# Patient Record
Sex: Female | Born: 1955 | Race: Black or African American | Hispanic: No | Marital: Married | Smoking: Never smoker
Health system: Southern US, Community
[De-identification: ages and names within clinical notes are randomized; demographics above are authoritative.]

## PROBLEM LIST (undated history)

## (undated) DIAGNOSIS — E785 Hyperlipidemia, unspecified: Secondary | ICD-10-CM

## (undated) DIAGNOSIS — E119 Type 2 diabetes mellitus without complications: Secondary | ICD-10-CM

## (undated) DIAGNOSIS — I10 Essential (primary) hypertension: Secondary | ICD-10-CM

## (undated) DIAGNOSIS — E079 Disorder of thyroid, unspecified: Secondary | ICD-10-CM

## (undated) HISTORY — DX: Hyperlipidemia, unspecified: E78.5

## (undated) HISTORY — DX: Type 2 diabetes mellitus without complications: E11.9

## (undated) HISTORY — DX: Disorder of thyroid, unspecified: E07.9

---

## 1978-11-10 HISTORY — PX: BREAST SURGERY: SHX581

## 2015-07-18 ENCOUNTER — Ambulatory Visit: Payer: Self-pay | Attending: Family Medicine

## 2015-07-20 ENCOUNTER — Ambulatory Visit: Payer: Self-pay

## 2015-07-24 ENCOUNTER — Ambulatory Visit: Payer: Self-pay

## 2015-09-17 ENCOUNTER — Ambulatory Visit (INDEPENDENT_AMBULATORY_CARE_PROVIDER_SITE_OTHER): Payer: Self-pay | Admitting: Internal Medicine

## 2015-09-17 ENCOUNTER — Encounter: Payer: Self-pay | Admitting: Internal Medicine

## 2015-09-17 VITALS — BP 130/64 | HR 57 | Temp 97.5°F | Wt 147.9 lb

## 2015-09-17 DIAGNOSIS — K1379 Other lesions of oral mucosa: Secondary | ICD-10-CM

## 2015-09-17 DIAGNOSIS — E119 Type 2 diabetes mellitus without complications: Secondary | ICD-10-CM

## 2015-09-17 DIAGNOSIS — E1169 Type 2 diabetes mellitus with other specified complication: Secondary | ICD-10-CM

## 2015-09-17 DIAGNOSIS — Z9229 Personal history of other drug therapy: Secondary | ICD-10-CM

## 2015-09-17 DIAGNOSIS — Z126 Encounter for screening for malignant neoplasm of bladder: Secondary | ICD-10-CM

## 2015-09-17 DIAGNOSIS — K062 Gingival and edentulous alveolar ridge lesions associated with trauma: Secondary | ICD-10-CM | POA: Insufficient documentation

## 2015-09-17 DIAGNOSIS — E785 Hyperlipidemia, unspecified: Secondary | ICD-10-CM

## 2015-09-17 DIAGNOSIS — E890 Postprocedural hypothyroidism: Secondary | ICD-10-CM

## 2015-09-17 DIAGNOSIS — K121 Other forms of stomatitis: Secondary | ICD-10-CM

## 2015-09-17 DIAGNOSIS — E0590 Thyrotoxicosis, unspecified without thyrotoxic crisis or storm: Secondary | ICD-10-CM

## 2015-09-17 DIAGNOSIS — E784 Other hyperlipidemia: Secondary | ICD-10-CM

## 2015-09-17 DIAGNOSIS — Z0000 Encounter for general adult medical examination without abnormal findings: Secondary | ICD-10-CM

## 2015-09-17 DIAGNOSIS — Z8619 Personal history of other infectious and parasitic diseases: Secondary | ICD-10-CM

## 2015-09-17 DIAGNOSIS — Z1239 Encounter for other screening for malignant neoplasm of breast: Secondary | ICD-10-CM

## 2015-09-17 LAB — COMPREHENSIVE METABOLIC PANEL
ALK PHOS: 96 U/L (ref 38–126)
ALT: 18 U/L (ref 14–54)
AST: 23 U/L (ref 15–41)
Albumin: 4.1 g/dL (ref 3.5–5.0)
Anion gap: 6 (ref 5–15)
BILIRUBIN TOTAL: 0.6 mg/dL (ref 0.3–1.2)
BUN: 10 mg/dL (ref 6–20)
CALCIUM: 10.1 mg/dL (ref 8.9–10.3)
CO2: 28 mmol/L (ref 22–32)
CREATININE: 0.61 mg/dL (ref 0.44–1.00)
Chloride: 108 mmol/L (ref 101–111)
GFR calc Af Amer: >60 mL/min (ref >60)
Glucose, Bld: 122 mg/dL — ABNORMAL HIGH (ref 65–99)
Potassium: 4.3 mmol/L (ref 3.5–5.1)
Sodium: 142 mmol/L (ref 135–145)
TOTAL PROTEIN: 7.2 g/dL (ref 6.5–8.1)

## 2015-09-17 LAB — POCT GLYCOSYLATED HEMOGLOBIN (HGB A1C): Hemoglobin A1C: 7.1

## 2015-09-17 LAB — GLUCOSE, CAPILLARY: Glucose-Capillary: 110 mg/dL — ABNORMAL HIGH (ref 65–99)

## 2015-09-17 MED ORDER — ATORVASTATIN CALCIUM 20 MG PO TABS: 20.0000 mg | ORAL_TABLET | Freq: Every day | ORAL | Status: DC

## 2015-09-17 MED ORDER — METFORMIN HCL 500 MG PO TABS: 500.0000 mg | ORAL_TABLET | Freq: Two times a day (BID) | ORAL | Status: DC

## 2015-09-17 MED ORDER — LISINOPRIL 5 MG PO TABS: 5.0000 mg | ORAL_TABLET | Freq: Every day | ORAL | Status: DC

## 2015-09-17 NOTE — Assessment & Plan Note (Signed)
Patient has previously been on multiple different statins, most recently atorvastatin 20. -Will continue atorvastatin 20mg  as this is a moderate intensity statin therapy as indicated for T2DM -Will obtain a baseline lipid panel here

## 2015-09-17 NOTE — Assessment & Plan Note (Signed)
Currently endorses hair falling out x 4 months without other obvious s/s of hyperthyroidism. On synthroid currently. -Will check TSH today -Adjust synthroid dose PRN once TSH results

## 2015-09-17 NOTE — Progress Notes (Signed)
   Patient ID: Roberta Mullins female   DOB: 03/17/1956 59 y.o.   MRN: [id number]  Subjective:   HPI: [name] PoundsMohamed Mullins is a 59 y.o. with PMH of DM2, multinodular goiter s/p RAI ablation in 2008 (now on synthroid), HLD and h/o HRT for menopausal symptoms (x2824yrs until 2010) who presents to [facility] today to establish care as a new patient in our clinic.   At this time, she says she is feeling well. She does complain of 'tightness' around her dentures that has persisted for several years and would like to see a dentist to evaluate. She also has noticed 2 small subcutaneous nodules in her bilateral 2nd DIP joints without any associated pain or stiffness. Finally, she says that for the past several months, she has noticed occasionally losing a small amount of urine when she laughs, coughs, or sneezes. She denies urinary urgency or frequency, and denies dysuria. She says she has been losing some hair when she brushes it over the past 3-4 months as well and is wondering whether or not she is taking too much synthroid. She denies other hyperthyroid symptoms, such as weight loss, heat intolerance, or palpitations.  Otherwise, she denies fevers, unexpected weight changes, malaise, fatigue, chest pain, cough, nausea, abdominal pain, changes in bowel function, numbness, weakness, leg swelling, or rashes.  Please see problem-based charting for status of medical issues pertinent to this visit.  Review of Systems: Pertinent items noted in HPI and remainder of comprehensive ROS otherwise negative.  PMH: Multinodular goiter s/p RAI ablation in 2008, now on synthroid 100mcg Type 2 DM without complication - for 5-6 years - on metformin, glimeperide, and lisinopril HLD - on atorvastatin 20mg  History of shingles in 2008 on R posterior thigh  PSH: Fibroadenoma excision in her late teens. Denture placement several years ago  Meds: See as above  Allergies: NKDA  Social: Patient is an Psychiatric nurseenvironmental  activist and professor. She and her family moved from [place] to be with her daughter, who was diagnosed with metastatic breast cancer and later deceased, as well as to take care of their grandchildren. She is a never-smoker, does not drink, and does not use recreational drugs.  Family Hx: Mother passed away from ovarian cancer in her 860s, sister was treated for DCIS of the breast with surgery and tamoxifen in her 9850s, daughter was diagnosed with metastatic IDC of breast at 4838 and passed away. Father had DM and HTN. Otherwise no other DM, HTN, CVD or other cancers noted in the family.  Objective:  Physical Exam: Filed Vitals:   09/17/15 0942  BP: 130/64  Pulse: 57  Temp: 97.5 F (36.4 C)  TempSrc: Oral  Weight: 147 lb 14.4 oz (67.087 kg)  SpO2: 100%   Gen: Well-appearing, alert and oriented to person, place, and time HEENT: Oropharynx clear without erythema or exudate.  Neck: No cervical LAD, no thyromegaly or nodules, no JVD noted. CV: Normal rate, regular rhythm, no murmurs, rubs, or gallops Pulmonary: Normal effort, CTA bilaterally, no wheezing, rales, or rhonchi Abdominal: Soft, non-tender, non-distended, without rebound, guarding, or masses Extremities: Distal pulses 2+ in upper and lower extremities bilaterally, no tenderness, erythema or edema Skin: No atypical appearing moles. No rashes  Assessment & Plan:  Please see problem-based charting for assessment and plan.  Roberta MilanBilly Nailah Luepke, MD Resident Physician, PGY-1 Department of Internal Medicine [facility]

## 2015-09-17 NOTE — Assessment & Plan Note (Signed)
-  Received flu shot today -Referral for mammogram today -Forgot to ask last colonoscopy and Pap - will ask at next follow-up and manage accordingly

## 2015-09-17 NOTE — Assessment & Plan Note (Addendum)
Patient was taking glimepiride 2mg  TID and metformin 500mg  TID, both only when she ate big meals. A1c today was 7.1, CBG 110. -Currently, patient agreeable to only taking metformin 500mg  BID, but not PRN, and to hold glimepiride at this time. Will plan on titrating up metformin as needed depending on glycemic control. -Continue lisinopril and atorvastatin -Obtain baseline BMP -Referral for eye exam

## 2015-09-17 NOTE — Assessment & Plan Note (Signed)
Patient says she has had tightness around her dentures causing discomfort for several years but has been unable to see a dentist. Exam today shows no obvious signs of candidiasis or other infection. -Will refer to dentistry for evaluation of denture fit

## 2015-09-17 NOTE — Patient Instructions (Signed)
I have made referrals to ophtalmology, dental, and mammography for your respective exams. All of these should be contacting you soon to make appointments. Let us know if you haven't been contacted soon.  We will check your thyroid levels and change the medicine accordingly. Until then, continue to take your medication as you have been.  For your diabetes medications, stop taking the Glimulin-2 altogether. Continue to take the metformin 500mg  twice a day with meals every day, whether or not you have large meals or not.   Continue taking the omega 3, lisinopril, and atorvastatin as directed. I have put in refills to [facility] on [address] as well. Let us know if you need anything else in the meantime.  It was very nice meeting you!  Roberta MilanBilly Loukisha Mullins

## 2015-09-18 ENCOUNTER — Telehealth: Payer: Self-pay | Admitting: Internal Medicine

## 2015-09-18 ENCOUNTER — Other Ambulatory Visit: Payer: Self-pay | Admitting: Internal Medicine

## 2015-09-18 DIAGNOSIS — E890 Postprocedural hypothyroidism: Secondary | ICD-10-CM

## 2015-09-18 LAB — CBC
HEMATOCRIT: 38.7 % (ref 34.0–46.6)
HEMOGLOBIN: 12.8 g/dL (ref 11.1–15.9)
MCH: 30.2 pg (ref 26.6–33.0)
MCHC: 33.1 g/dL (ref 31.5–35.7)
MCV: 91 fL (ref 79–97)
Platelets: 317 10*3/uL (ref 150–379)
RBC: 4.24 x10E6/uL (ref 3.77–5.28)
RDW: 14.4 % (ref 12.3–15.4)
WBC: 4.7 10*3/uL (ref 3.4–10.8)

## 2015-09-18 LAB — LIPID PANEL
CHOL/HDL RATIO: 2.8 ratio (ref 0.0–4.4)
Cholesterol, Total: 184 mg/dL (ref 100–199)
HDL: 65 mg/dL (ref >39)
LDL Calculated: 108 mg/dL — ABNORMAL HIGH (ref 0–99)
Triglycerides: 55 mg/dL (ref 0–149)
VLDL Cholesterol Cal: 11 mg/dL (ref 5–40)

## 2015-09-18 LAB — TSH: TSH: 2.94 u[IU]/mL (ref 0.450–4.500)

## 2015-09-18 MED ORDER — LEVOTHYROXINE SODIUM 100 MCG PO TABS: 100.0000 ug | ORAL_TABLET | Freq: Every day | ORAL | Status: DC

## 2015-09-18 NOTE — Telephone Encounter (Signed)
Pt called requesting lipitor, levothyroxine and lisinopril to be filled @ [facility] [address] [facility].

## 2015-09-18 NOTE — Telephone Encounter (Signed)
  Reason for call:   I placed an outgoing call to [name] at 2:10  PM regarding TSH results.   Assessment/ Plan:   TSH is wnl - continue synthroid 100mcg QD  Pt informed to keep us posted on her hair falling out and occasional sleep difficulties, may consider grief/stress response vs too high synthroid dose (although TSH wnl today)  As always, pt is advised that if symptoms worsen or new symptoms arise, they should go to an urgent care facility or to to ER for further evaluation.   Darrick HuntsmanWilliam R Brooklyn Alfredo, MD   09/18/2015, 2:14 PM

## 2015-10-01 ENCOUNTER — Ambulatory Visit
Admission: RE | Admit: 2015-10-01 | Discharge: 2015-10-01 | Disposition: A | Discharge status: Home / Self Care | Payer: No Typology Code available for payment source | Source: Ambulatory Visit | Attending: Internal Medicine | Admitting: Internal Medicine

## 2015-10-01 DIAGNOSIS — Z1239 Encounter for other screening for malignant neoplasm of breast: Secondary | ICD-10-CM

## 2015-10-01 NOTE — Progress Notes (Signed)
I saw and evaluated the patient.  I personally confirmed the key portions of Dr. Kennedy's history and exam and reviewed pertinent patient test results.  The assessment, diagnosis, and plan were formulated together and I agree with the documentation in the resident's note. 

## 2015-10-07 ENCOUNTER — Encounter (HOSPITAL_COMMUNITY): Payer: Self-pay | Admitting: *Deleted

## 2015-10-07 ENCOUNTER — Emergency Department (INDEPENDENT_AMBULATORY_CARE_PROVIDER_SITE_OTHER)
Admission: EM | Admit: 2015-10-07 | Discharge: 2015-10-07 | Disposition: A | Discharge status: Home / Self Care | Payer: No Typology Code available for payment source | Source: Home / Self Care | Attending: Family Medicine | Admitting: Family Medicine

## 2015-10-07 ENCOUNTER — Other Ambulatory Visit (HOSPITAL_COMMUNITY)
Admission: RE | Admit: 2015-10-07 | Discharge: 2015-10-07 | Disposition: A | Discharge status: Home / Self Care | Payer: No Typology Code available for payment source | Source: Ambulatory Visit | Attending: Family Medicine | Admitting: Family Medicine

## 2015-10-07 DIAGNOSIS — J329 Chronic sinusitis, unspecified: Secondary | ICD-10-CM

## 2015-10-07 DIAGNOSIS — N760 Acute vaginitis: Secondary | ICD-10-CM | POA: Insufficient documentation

## 2015-10-07 DIAGNOSIS — Z113 Encounter for screening for infections with a predominantly sexual mode of transmission: Secondary | ICD-10-CM | POA: Insufficient documentation

## 2015-10-07 DIAGNOSIS — R300 Dysuria: Secondary | ICD-10-CM

## 2015-10-07 HISTORY — DX: Essential (primary) hypertension: I10

## 2015-10-07 LAB — POCT URINALYSIS DIP (DEVICE)
BILIRUBIN URINE: NEGATIVE
GLUCOSE, UA: NEGATIVE mg/dL
Ketones, ur: NEGATIVE mg/dL
NITRITE: NEGATIVE
Protein, ur: 30 mg/dL — AB
Specific Gravity, Urine: 1.025 (ref 1.005–1.030)
UROBILINOGEN UA: 0.2 mg/dL (ref 0.0–1.0)
pH: 5.5 (ref 5.0–8.0)

## 2015-10-07 MED ORDER — CEPHALEXIN 500 MG PO CAPS: 500.0000 mg | ORAL_CAPSULE | Freq: Three times a day (TID) | ORAL | Status: DC

## 2015-10-07 MED ORDER — FLUCONAZOLE 100 MG PO TABS: 100.0000 mg | ORAL_TABLET | Freq: Once | ORAL | Status: AC

## 2015-10-07 NOTE — ED Provider Notes (Addendum)
CSN: [id number]     Arrival date & time 10/07/15  1302 History   First MD Initiated Contact with Patient 10/07/15 1357     Chief Complaint  Patient presents with  . Vaginal Discharge  . Urinary Tract Infection   (Consider location/radiation/quality/duration/timing/severity/associated sxs/prior Treatment) Patient is a 59 y.o. female presenting with vaginal discharge and dysuria. The history is provided by the patient. No language interpreter was used.  Vaginal Discharge Quality:  White Duration:  7 weeks Timing:  Constant Associated symptoms: dysuria and fever   Associated symptoms: no vomiting   Dysuria Pain quality:  Burning Onset quality:  Sudden Duration:  2 days Timing:  Constant Progression:  Worsening Chronicity:  New Recent urinary tract infections: no   Relieved by:  Nothing Urinary symptoms: discolored urine and incontinence   Urinary symptoms: no foul-smelling urine and no hematuria   Urinary symptoms comment:  Urge to urinate Associated symptoms: fever, flank pain and vaginal discharge   Associated symptoms: no vomiting   Sinus headache: C/O nasal congestion causing headache. She has hx of sinus problem. She mentioned this at the end of the visit.  Past Medical History  Diagnosis Date  . Diabetes mellitus without complication (HCC)   . Hyperlipidemia   . Thyroid disease   . Hypertension    Past Surgical History  Procedure Laterality Date  . Breast surgery  1980    Fibroadenoma excision   Family History  Problem Relation Age of Onset  . Cancer Mother 21    Ovarian cancer  . Diabetes Father   . Hypertension Father   . Cancer Sister 33    Breast cancer  . Cancer Daughter 52    Breast cancer   Social History  Substance Use Topics  . Smoking status: Never Smoker   . Smokeless tobacco: Never Used  . Alcohol Use: No   OB History    No data available     Review of Systems  Constitutional: Positive for fever.  HENT: Positive for sinus pressure.     Respiratory: Negative.   Gastrointestinal: Negative for vomiting.  Genitourinary: Positive for dysuria, flank pain and vaginal discharge.  All other systems reviewed and are negative.   Allergies  Review of patient's allergies indicates no known allergies.  Home Medications   Prior to Admission medications   Medication Sig Start Date End Date Taking? Authorizing Provider  atorvastatin (LIPITOR) 20 MG tablet Take 1 tablet (20 mg total) by mouth daily. 09/17/15 09/16/16 Yes Darrick Huntsman, MD  levothyroxine (SYNTHROID, LEVOTHROID) 100 MCG tablet Take 1 tablet (100 mcg total) by mouth daily before breakfast. 09/18/15  Yes Darrick Huntsman, MD  lisinopril (PRINIVIL,ZESTRIL) 5 MG tablet Take 1 tablet (5 mg total) by mouth daily. 09/17/15 09/16/16 Yes Darrick Huntsman, MD  metFORMIN (GLUCOPHAGE) 500 MG tablet Take 1 tablet (500 mg total) by mouth 2 (two) times daily with a meal. 09/17/15  Yes Darrick Huntsman, MD  omega-3 acid ethyl esters (LOVAZA) 1 G capsule Take 1 g by mouth 2 (two) times daily.    Historical Provider, MD   Meds Ordered and Administered this Visit  Medications - No data to display  BP 94/62 mmHg  Pulse 76  Temp(Src) 98.1 F (36.7 C) (Oral)  SpO2 99% No data found.   Physical Exam  Constitutional: She appears well-developed. No distress.  HENT:  Head: Normocephalic.  Eyes: Pupils are equal, round, and reactive to light.  Cardiovascular: Normal rate, regular rhythm, normal heart sounds  and intact distal pulses.   No murmur heard. Pulmonary/Chest: Effort normal and breath sounds normal. No respiratory distress. She has no wheezes.  Abdominal: Soft. Bowel sounds are normal. She exhibits no distension and no mass. There is no tenderness.  Genitourinary: There is no rash or lesion on the right labia. There is no rash or lesion on the left labia. Cervix exhibits discharge. Cervix exhibits no friability. Right adnexum displays no tenderness. Left adnexum displays no  tenderness. Vaginal discharge found.    Neurological: She is alert.  Nursing note and vitals reviewed.   ED Course  Procedures (including critical care time)  Labs Review Labs Reviewed - No data to display  Imaging Review No results found.   Visual Acuity Review  Right Eye Distance:   Left Eye Distance:   Bilateral Distance:    Right Eye Near:   Left Eye Near:    Bilateral Near:      Urinalysis    Component Value Date/Time   LABSPEC 1.025 10/07/2015 1358   PHURINE 5.5 10/07/2015 1358   GLUCOSEU NEGATIVE 10/07/2015 1358   HGBUR SMALL* 10/07/2015 1358   BILIRUBINUR NEGATIVE 10/07/2015 1358   KETONESUR NEGATIVE 10/07/2015 1358   PROTEINUR 30* 10/07/2015 1358   UROBILINOGEN 0.2 10/07/2015 1358   NITRITE NEGATIVE 10/07/2015 1358   LEUKOCYTESUR MODERATE* 10/07/2015 1358        MDM  No diagnosis found. Vaginitis  Dysuria  Recurrent sinusitis, unspecified chronicity, unspecified location  Suspicious of yeast vaginitis. Vaginal specimen collected for wet prep. Diflucan prescribed pending test result. She requested for GC/Chlamydia test since her husband has other partners. I recommended HIV test but she declined. UA only positive for Leukocyte with neg nitrite. Urine sent to lab for culture. I gave her prescription for Keflex. I will contact her with urine culture report.      Doreene ElandKehinde T Wilfrid Hyser, MD 10/07/15 [phone]

## 2015-10-07 NOTE — ED Notes (Signed)
C/O pruritic vaginal discharge x 1 wk.  Yesterday started feeling feverish with chills, decreased appetite, and low back pain.  C/O dysuria, polyuria, and urinary urgency.  States blood sugars have been doing fine - had recent HgbA1C check - 7; PCP decreased metformin dosage.  Pt has used Monistat 3 with relief of discharge, then sxs returned.

## 2015-10-07 NOTE — Discharge Instructions (Signed)
It was nice seeing you today. i am sorry you don't feel well. Your urinalysis is suggestive of UTI. Please use the antibiotic given. I will call you with your culture report. For your sinus headache please use tylenol as needed as well for fever. Use otc Zyrtec for sinusitis. See us as needed.   Dysuria Dysuria is pain or discomfort while urinating. The pain or discomfort may be felt in the tube that carries urine out of the bladder (urethra) or in the surrounding tissue of the genitals. The pain may also be felt in the groin area, lower abdomen, and lower back. You may have to urinate frequently or have the sudden feeling that you have to urinate (urgency). Dysuria can affect both men and women, but is more common in women. Dysuria can be caused by many different things, including:  Urinary tract infection in women.  Infection of the kidney or bladder.  Kidney stones or bladder stones.  Certain sexually transmitted infections (STIs), such as chlamydia.  Dehydration.  Inflammation of the vagina.  Use of certain medicines.  Use of certain soaps or scented products that cause irritation. HOME CARE INSTRUCTIONS Watch your dysuria for any changes. The following actions may help to reduce any discomfort you are feeling:  Drink enough fluid to keep your urine clear or pale yellow.  Empty your bladder often. Avoid holding urine for long periods of time.  After a bowel movement or urination, women should cleanse from front to back, using each tissue only once.  Empty your bladder after sexual intercourse.  Take medicines only as directed by your health care provider.  If you were prescribed an antibiotic medicine, finish it all even if you start to feel better.  Avoid caffeine, tea, and alcohol. They can irritate the bladder and make dysuria worse. In men, alcohol may irritate the prostate.  Keep all follow-up visits as directed by your health care provider. This is important.  If  you had any tests done to find the cause of dysuria, it is your responsibility to obtain your test results. Ask the lab or department performing the test when and how you will get your results. Talk with your health care provider if you have any questions about your results. SEEK MEDICAL CARE IF:  You develop pain in your back or sides.  You have a fever.  You have nausea or vomiting.  You have blood in your urine.  You are not urinating as often as you usually do. SEEK IMMEDIATE MEDICAL CARE IF:  You pain is severe and not relieved with medicines.  You are unable to hold down any fluids.  You or someone else notices a change in your mental function.  You have a rapid heartbeat at rest.  You have shaking or chills.  You feel extremely weak.   This information is not intended to replace advice given to you by your health care provider. Make sure you discuss any questions you have with your health care provider.   Document Released: 07/25/2004 Document Revised: 11/17/2014 Document Reviewed: 06/22/2014 Elsevier Interactive Patient Education [employer]vier Inc.

## 2015-10-08 LAB — URINE CULTURE

## 2015-10-08 LAB — CERVICOVAGINAL ANCILLARY ONLY
Chlamydia: NEGATIVE
NEISSERIA GONORRHEA: NEGATIVE
TRICH (WINDOWPATH): NEGATIVE
Wet Prep (BD Affirm): NEGATIVE

## 2015-10-08 NOTE — ED Notes (Signed)
Final report of vaginal swab testing negative  

## 2015-11-29 ENCOUNTER — Ambulatory Visit (INDEPENDENT_AMBULATORY_CARE_PROVIDER_SITE_OTHER): Payer: No Typology Code available for payment source | Admitting: Internal Medicine

## 2015-11-29 VITALS — BP 118/71 | HR 71 | Temp 97.5°F | Wt 152.0 lb

## 2015-11-29 DIAGNOSIS — K0889 Other specified disorders of teeth and supporting structures: Secondary | ICD-10-CM

## 2015-11-29 DIAGNOSIS — E784 Other hyperlipidemia: Secondary | ICD-10-CM

## 2015-11-29 DIAGNOSIS — E1169 Type 2 diabetes mellitus with other specified complication: Secondary | ICD-10-CM

## 2015-11-29 DIAGNOSIS — K062 Gingival and edentulous alveolar ridge lesions associated with trauma: Secondary | ICD-10-CM

## 2015-11-29 DIAGNOSIS — Z1239 Encounter for other screening for malignant neoplasm of breast: Secondary | ICD-10-CM

## 2015-11-29 DIAGNOSIS — E785 Hyperlipidemia, unspecified: Principal | ICD-10-CM

## 2015-11-29 DIAGNOSIS — Z0000 Encounter for general adult medical examination without abnormal findings: Secondary | ICD-10-CM

## 2015-11-29 DIAGNOSIS — Z7984 Long term (current) use of oral hypoglycemic drugs: Secondary | ICD-10-CM

## 2015-11-29 DIAGNOSIS — E119 Type 2 diabetes mellitus without complications: Secondary | ICD-10-CM

## 2015-11-29 LAB — HM DIABETES EYE EXAM

## 2015-11-29 MED ORDER — ZOSTER VACCINE LIVE 19400 UNT/0.65ML ~~LOC~~ SOLR: 0.6500 mL | Freq: Once | SUBCUTANEOUS | Status: AC

## 2015-11-29 MED ORDER — OMEGA-3-ACID ETHYL ESTERS 1 G PO CAPS: 1.0000 g | ORAL_CAPSULE | Freq: Two times a day (BID) | ORAL | Status: AC

## 2015-11-29 MED ORDER — ATORVASTATIN CALCIUM 20 MG PO TABS: 20.0000 mg | ORAL_TABLET | Freq: Every day | ORAL | Status: DC

## 2015-11-29 NOTE — Progress Notes (Signed)
Patient ID: Roberta Mullins, female   DOB: [birth year], 60 y.o.   MRN: [id number]   Subjective:   Patient ID: Roberta Mullins female   DOB: 08-04-1956 60 y.o.   MRN: [id number]  HPI: [name] is a 60 y.o. female with past medical history as detailed below presenting today for follow up denture irritation, hyperlipidemia, and diabetes.  Please see A&P for status of patients medical conditions addressed at today's visit.    Past Medical History  Diagnosis Date  . Diabetes mellitus without complication (HCC)   . Hyperlipidemia   . Thyroid disease   . Hypertension    Current Outpatient Prescriptions  Medication Sig Dispense Refill  . atorvastatin (LIPITOR) 20 MG tablet Take 1 tablet (20 mg total) by mouth daily. 90 tablet 3  . cephALEXin (KEFLEX) 500 MG capsule Take 1 capsule (500 mg total) by mouth 3 (three) times daily. 21 capsule 0  . levothyroxine (SYNTHROID, LEVOTHROID) 100 MCG tablet Take 1 tablet (100 mcg total) by mouth daily before breakfast. 30 tablet 11  . lisinopril (PRINIVIL,ZESTRIL) 5 MG tablet Take 1 tablet (5 mg total) by mouth daily. 30 tablet 11  . metFORMIN (GLUCOPHAGE) 500 MG tablet Take 1 tablet (500 mg total) by mouth 2 (two) times daily with a meal. 60 tablet 11  . omega-3 acid ethyl esters (LOVAZA) 1 g capsule Take 1 capsule (1 g total) by mouth 2 (two) times daily. 180 capsule 3  . zoster vaccine live, PF, (ZOSTAVAX) 40102 UNT/0.65ML injection Inject 19,400 Units into the skin once. 1 each 0   No current facility-administered medications for this visit.   Family History  Problem Relation Age of Onset  . Cancer Mother 71    Ovarian cancer  . Diabetes Father   . Hypertension Father   . Cancer Sister 35    Breast cancer  . Cancer Daughter 38    Breast cancer   Social History   Social History  . Marital Status: Married    Spouse Name: N/A  . Number of Children: N/A  . Years of Education: N/A   Social History Main Topics    . Smoking status: Never Smoker   . Smokeless tobacco: Never Used  . Alcohol Use: No  . Drug Use: No  . Sexual Activity: Yes   Other Topics Concern  . Not on file   Social History Narrative   Review of Systems: Review of Systems  Constitutional: Negative for fever and chills.  Eyes: Positive for blurred vision.  Respiratory: Negative for cough and shortness of breath.   Cardiovascular: Negative for chest pain and leg swelling.  Genitourinary: Negative for dysuria.  Musculoskeletal: Negative for falls.  Neurological: Negative for headaches.  Psychiatric/Behavioral: The patient has insomnia.     Objective:  Physical Exam: Filed Vitals:   11/29/15 1427  BP: 118/71  Pulse: 71  Temp: 97.5 F (36.4 C)  TempSrc: Oral  Weight: 152 lb (68.947 kg)  SpO2: 100%   Physical Exam  Constitutional: She is oriented to person, place, and time. She appears well-developed and well-nourished.  HENT:  Head: Normocephalic and atraumatic.  Mouth/Throat: Oropharynx is clear and moist. No oropharyngeal exudate.  Neck: Normal range of motion.  Cardiovascular: Normal rate and regular rhythm.   Pulmonary/Chest: Effort normal and breath sounds normal.  Musculoskeletal: Normal range of motion.  Neurological: She is alert and oriented to person, place, and time.  Skin: Skin is warm and dry.    Assessment &  Plan:   Please see problem list for assessment and plan.  Case discussed with Dr. Criselda Peaches.

## 2015-11-29 NOTE — Assessment & Plan Note (Signed)
Assessment: Patient compliant with atorvastatin and Omega 3 fatty acids.  Asking for 90 day prescrition today.  Plan: - refill atorvastatin and Omega-3 for 90 days each

## 2015-11-29 NOTE — Patient Instructions (Signed)
Today we placed a referral for you to see an Eye Doctor for your blurry vision and a Dentist for your denture irritation.  I have given you new prescriptions for your Atorvastatin and your Omega-3 medications.  I have given you a prescription for a shingles vaccine to take to the pharmacy.

## 2015-11-29 NOTE — Assessment & Plan Note (Signed)
Assessment: Patient states she has continued tightness around her dentures that has been present for many months/years but has been unable to see dentist.  Referral placed at last clinic visit, unsure of what happened with this.  Exam today does not show signs of candida infection or abscess.  No tenderness to palpation, no change in character of her discomfort.  Plan: - referral placed again today for dentistry evaluation

## 2015-11-29 NOTE — Assessment & Plan Note (Addendum)
Assessment: Patient currently on Metformin  BID but previously had also been on Glimepiride.  Her A1C on Nov 7 was 7.1.  Not due for A1C today.  She is due for eye exam and had this referral placed as well in Nov but was been unable to see eye doctor yet.  She is complaining of some blurry vision today.  Plan: - continue Metformin  BID - ophthalmology referral placed today.  Also got retinal photos today in the clinic. - check A1C at next visit.  May need to consider restarting Glimepiride or other 2nd oral agent.  Can also consider increasing Metformin dose.

## 2015-12-03 NOTE — Progress Notes (Signed)
Internal Medicine Clinic Attending  Case discussed with Dr. Wallace at the time of the visit.  We reviewed the resident's history and exam and pertinent patient test results.  I agree with the assessment, diagnosis, and plan of care documented in the resident's note.  

## 2015-12-11 ENCOUNTER — Encounter: Payer: Self-pay | Admitting: Dietician

## 2015-12-28 ENCOUNTER — Encounter: Payer: Self-pay | Admitting: Internal Medicine

## 2015-12-28 ENCOUNTER — Other Ambulatory Visit: Payer: Self-pay | Admitting: *Deleted

## 2016-01-14 ENCOUNTER — Telehealth: Payer: Self-pay | Admitting: Internal Medicine

## 2016-01-14 NOTE — Telephone Encounter (Signed)
APPT. REMINDER CALL, NO ANSWER, NO VOICE MAIL °

## 2016-01-15 ENCOUNTER — Ambulatory Visit: Payer: No Typology Code available for payment source

## 2016-01-17 NOTE — Telephone Encounter (Signed)
Review of meds

## 2016-01-24 ENCOUNTER — Ambulatory Visit (INDEPENDENT_AMBULATORY_CARE_PROVIDER_SITE_OTHER): Payer: Self-pay | Admitting: Internal Medicine

## 2016-01-24 ENCOUNTER — Encounter: Payer: Self-pay | Admitting: Internal Medicine

## 2016-01-24 VITALS — BP 99/58 | HR 66 | Temp 97.6°F | Ht 65.5 in | Wt 153.4 lb

## 2016-01-24 DIAGNOSIS — B309 Viral conjunctivitis, unspecified: Secondary | ICD-10-CM | POA: Insufficient documentation

## 2016-01-24 DIAGNOSIS — Z0000 Encounter for general adult medical examination without abnormal findings: Secondary | ICD-10-CM

## 2016-01-24 DIAGNOSIS — H1031 Unspecified acute conjunctivitis, right eye: Secondary | ICD-10-CM

## 2016-01-24 DIAGNOSIS — N3946 Mixed incontinence: Secondary | ICD-10-CM

## 2016-01-24 DIAGNOSIS — E1165 Type 2 diabetes mellitus with hyperglycemia: Secondary | ICD-10-CM

## 2016-01-24 DIAGNOSIS — Z7984 Long term (current) use of oral hypoglycemic drugs: Secondary | ICD-10-CM

## 2016-01-24 DIAGNOSIS — E119 Type 2 diabetes mellitus without complications: Secondary | ICD-10-CM

## 2016-01-24 LAB — GLUCOSE, CAPILLARY: GLUCOSE-CAPILLARY: 179 mg/dL — AB (ref 65–99)

## 2016-01-24 LAB — POCT GLYCOSYLATED HEMOGLOBIN (HGB A1C): HEMOGLOBIN A1C: 8.1

## 2016-01-24 MED ORDER — METFORMIN HCL 1000 MG PO TABS: 1000.0000 mg | ORAL_TABLET | Freq: Two times a day (BID) | ORAL | Status: DC

## 2016-01-24 NOTE — Assessment & Plan Note (Signed)
Does have components of both stress incontinence, with multiparous vaginal deliveries, postmenopausal, no history of prolapse, as well as continuous incontinence. -[facility] and further evaluation

## 2016-01-24 NOTE — Assessment & Plan Note (Signed)
Most likely viral conjunctivitis, given presentation, sick contacts. Although it is unilateral, which favors an allergic vs bacterial process. -Instructed to use warm compresses frequently throughout the day as well as continuing her home tea remedy -OTC eyedrops (Visine or similar) -Informed to call us if symptoms do not improve in 7 days

## 2016-01-24 NOTE — Patient Instructions (Signed)
Thanks for coming in! Continue to fight the sweet tooth, despite how good ginger snaps are. To help, I have increased your metformin to 1000mg  BID.  Also, for your eye, use warm wet cloth over your eye several times per day to help with the irritation, redness, and swelling. Continue to use the tea washes. I would also recommend you use Visine or another over-the-counter eye drop to help with the inflammation. If it does not continue to improve in the next 1 week or if you get worse, please call the clinic. I have also referred you to ophthalmology for routine diabetic eye care and glaucoma evaluation.  Finally, for leakage, I have referred you to incontinence specialists.  Please let us know if you haven't heard from the ophthalmologist or urogynecologists in the next 2 weeks to make an appointment.  Thank you, Roberta Mullins

## 2016-01-24 NOTE — Progress Notes (Signed)
   Patient ID: Roberta Mullins female   DOB: 08/24/1956 60 y.o.   MRN: [id number]  Subjective:   HPI: [name] is a 60 y.o. with PMH of DM2, multinodular goiter s/p RAI ablation in 2008 (now on synthroid), and remote h/o HRT for menopausal symptoms who presents to [facility] today for follow-up of her DM2. Today, she complains that for the past 2 days, she has had an itchy, red right eye that has occasional clear-whitish discharge making her vision blurry, pain along the medial canthus, as well as periorbital redness and swelling. She has tried homemade tea washes that has helped the swelling and redness. She denies any other associated symptoms. She says 'everyone in the house has a cold'.   Additionally, she says she has urinary dribbling both intermittently throughout the day as well as when she coughs, sneezes, gets up too quickly. She also has increased frequency at night. She denies dysuria, change in odor or color, vaginal discharge, sudden urge to urinate, or other symptoms. She denies uterine prolapse. She has had multiple children all through spontaneous vaginal delivery.  Please see problem-based charting for status of medical issues pertinent to this visit.  Review of Systems: Pertinent items noted in HPI and remainder of comprehensive ROS otherwise negative.  Objective:  Physical Exam: Filed Vitals:   01/24/16 1429  BP: 99/58  Pulse: 66  Temp: 97.6 F (36.4 C)  TempSrc: Oral  Height: 5' 5.5" (1.664 m)  Weight: 153 lb 6.4 oz (69.582 kg)  SpO2: 100%   Gen: Well-appearing, alert and oriented to person, place, and time HEENT: Oropharynx clear without erythema or exudate. Rt eye with conjunctival erythema, slight periorbital swelling but without erythema. EOM intact. Visual acuity grossly symmetric. No proptosis/chemosis. Slight increased clear discharge from medial canthus. Lt eye without abnormalities. Neck: No cervical LAD, no thyromegaly or nodules, no JVD  noted. CV: Normal rate, regular rhythm, no murmurs, rubs, or gallops Pulmonary: Normal effort, CTA bilaterally, no wheezing, rales, or rhonchi Abdominal: Soft, non-tender, non-distended, without rebound, guarding, or masses Extremities: Distal pulses 2+ in upper and lower extremities bilaterally, no tenderness, erythema or edema. Feet with normal monofilament testing. No ulcers or cracked skin. Skin: No atypical appearing moles. No rashes.  Assessment & Plan:  Please see problem-based charting for assessment and plan.  Reubin MilanBilly Sheana Bir, MD Resident Physician, PGY-1 Department of Internal Medicine [facility]

## 2016-01-24 NOTE — Assessment & Plan Note (Signed)
Underwent diabetic foot exam today, otherwise up to date

## 2016-01-24 NOTE — Assessment & Plan Note (Addendum)
Lab Results  Component Value Date   HGBA1C 8.1 01/24/2016   HGBA1C 7.1 09/17/2015     Assessment: Diabetes control:   deteriorated Progress toward A1C goal:   not at goal Comments: On metformin 500BID  Plan: Medications:  Increase to metformin 1000 BID Instruction/counseling given: reminded to get eye exam, discussed foot care and discussed diet Other plans: F/u in 3 months. ALso placed ophthalmology referral for evaluation of glaucoma.

## 2016-01-25 NOTE — Progress Notes (Signed)
Internal Medicine Clinic Attending  Case discussed with Dr. Kennedy at the time of the visit.  We reviewed the resident's history and exam and pertinent patient test results.  I agree with the assessment, diagnosis, and plan of care documented in the resident's note.  

## 2016-03-04 ENCOUNTER — Ambulatory Visit: Payer: Self-pay

## 2016-04-02 ENCOUNTER — Ambulatory Visit: Payer: Self-pay

## 2016-04-16 ENCOUNTER — Encounter: Payer: Self-pay | Admitting: Internal Medicine

## 2016-04-16 ENCOUNTER — Other Ambulatory Visit: Payer: Self-pay | Admitting: *Deleted

## 2016-04-16 ENCOUNTER — Encounter: Payer: Self-pay | Admitting: *Deleted

## 2016-04-16 NOTE — Telephone Encounter (Signed)
Pt has refills, called pharm

## 2016-04-29 ENCOUNTER — Encounter: Payer: Self-pay | Admitting: *Deleted

## 2016-05-13 ENCOUNTER — Encounter: Payer: Self-pay | Admitting: Internal Medicine

## 2016-06-11 ENCOUNTER — Ambulatory Visit: Payer: Self-pay

## 2016-06-17 ENCOUNTER — Ambulatory Visit: Payer: Self-pay

## 2016-07-02 ENCOUNTER — Ambulatory Visit: Payer: Self-pay

## 2016-08-08 ENCOUNTER — Other Ambulatory Visit: Payer: Self-pay | Admitting: Internal Medicine

## 2016-08-08 DIAGNOSIS — E890 Postprocedural hypothyroidism: Secondary | ICD-10-CM

## 2016-08-25 IMAGING — MG MM SCREEN MAMMOGRAM BILATERAL
4 series · 4 of 4 positions shown · non-contrast
Comparison: None.

CLINICAL DATA: Screening.

EXAM:
DIGITAL SCREENING BILATERAL MAMMOGRAM WITH CAD

[R CC]
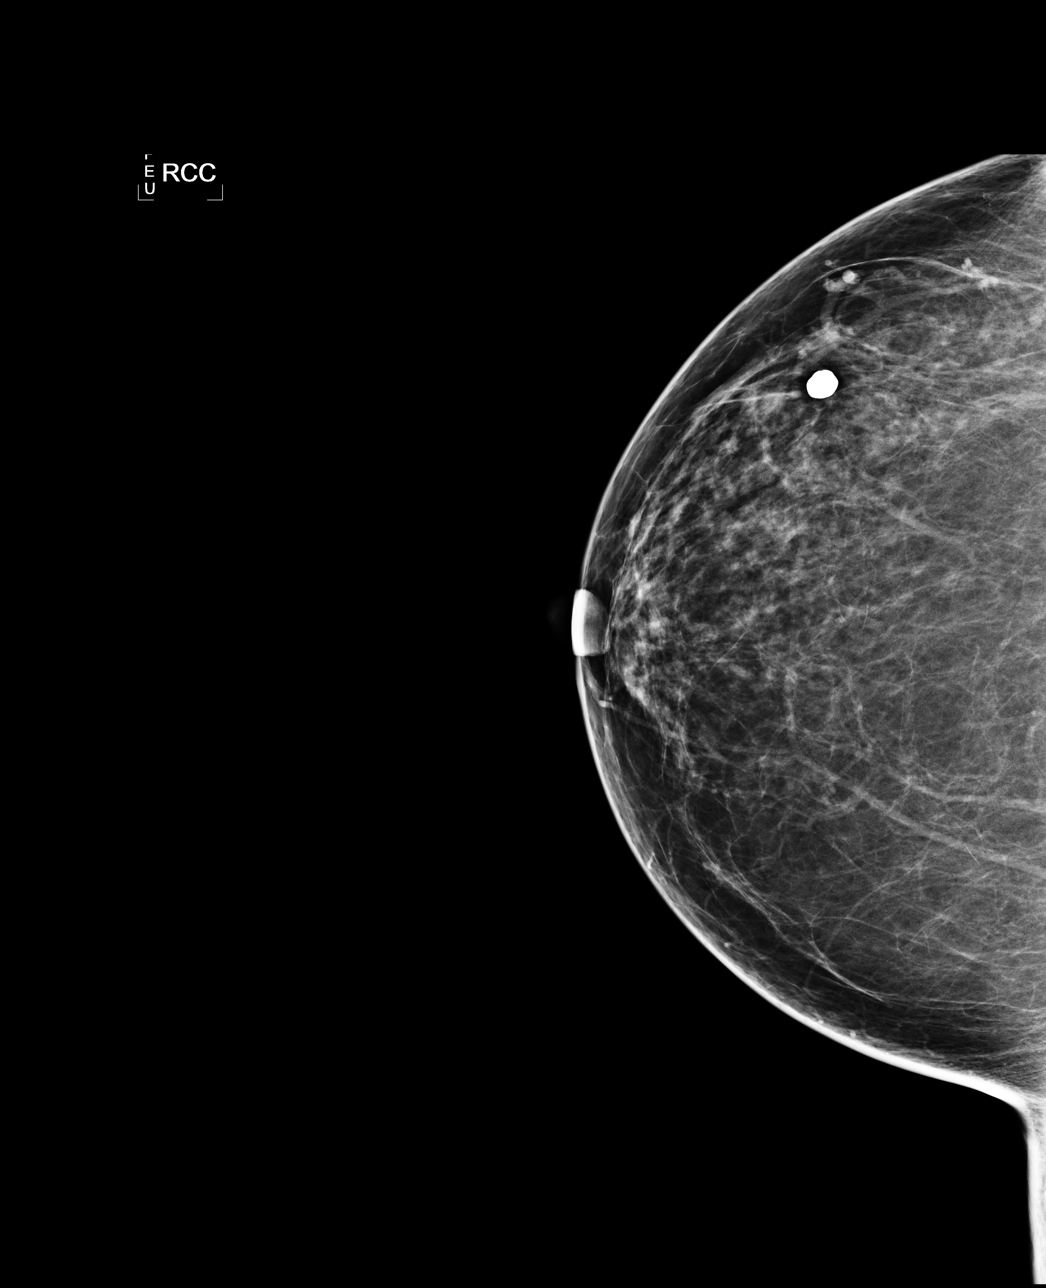

[L CC]
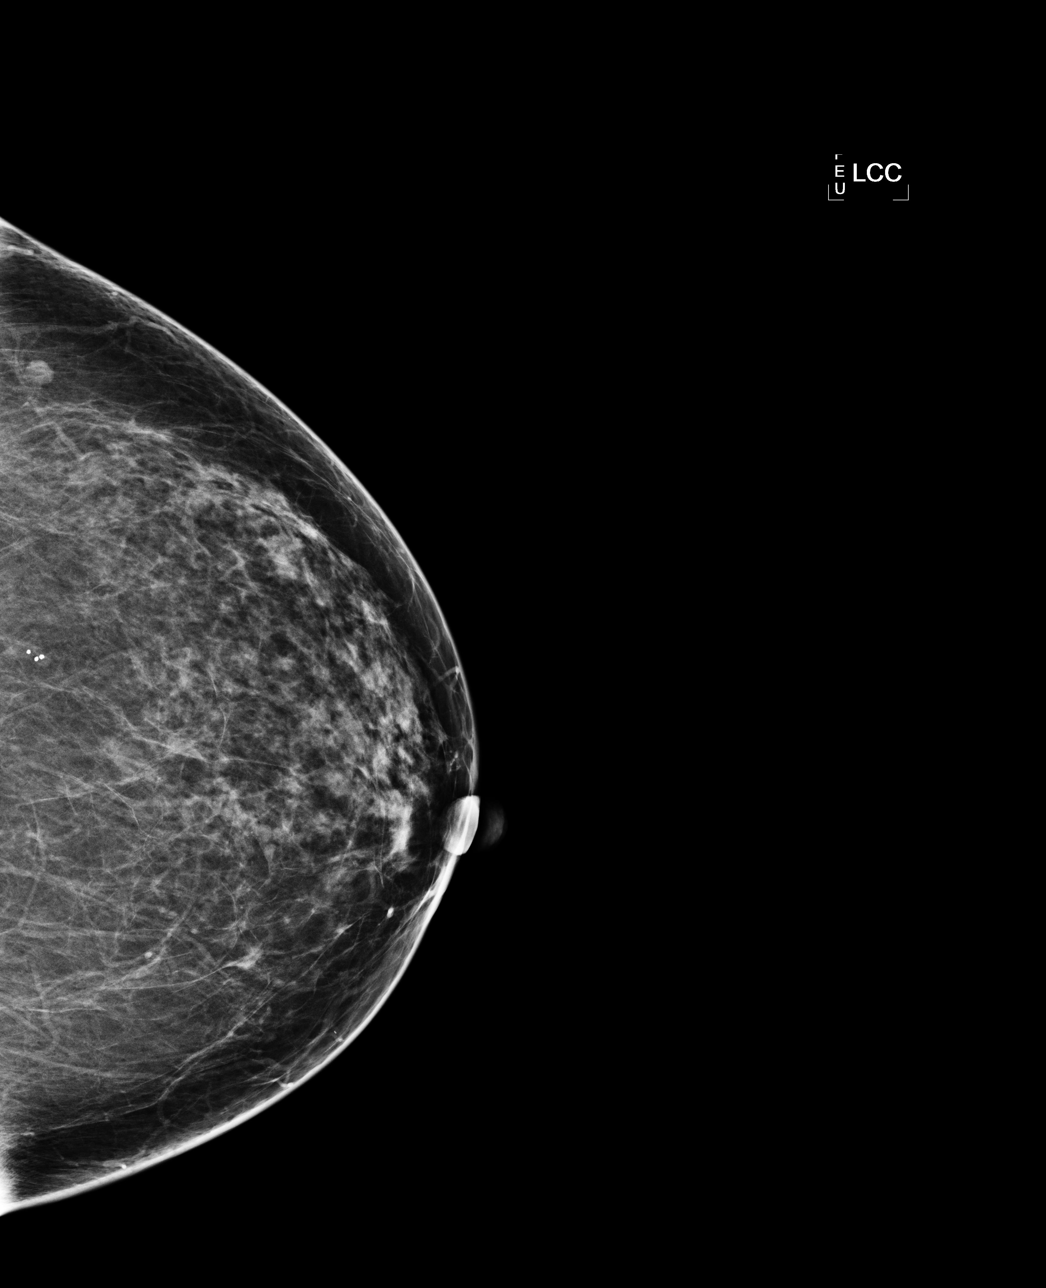

[L MLO]
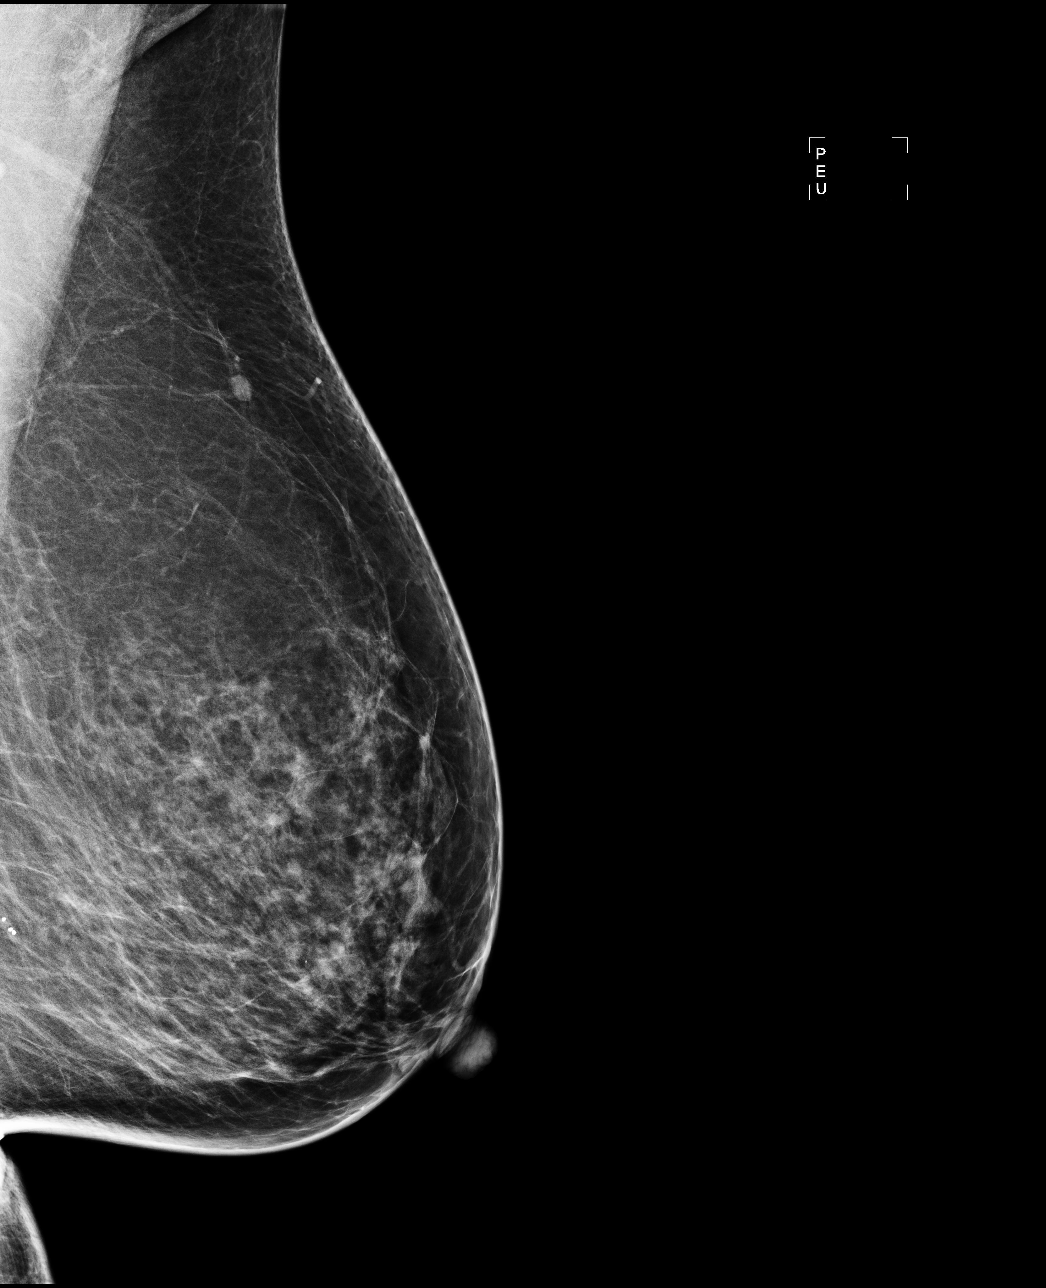

[R MLO]
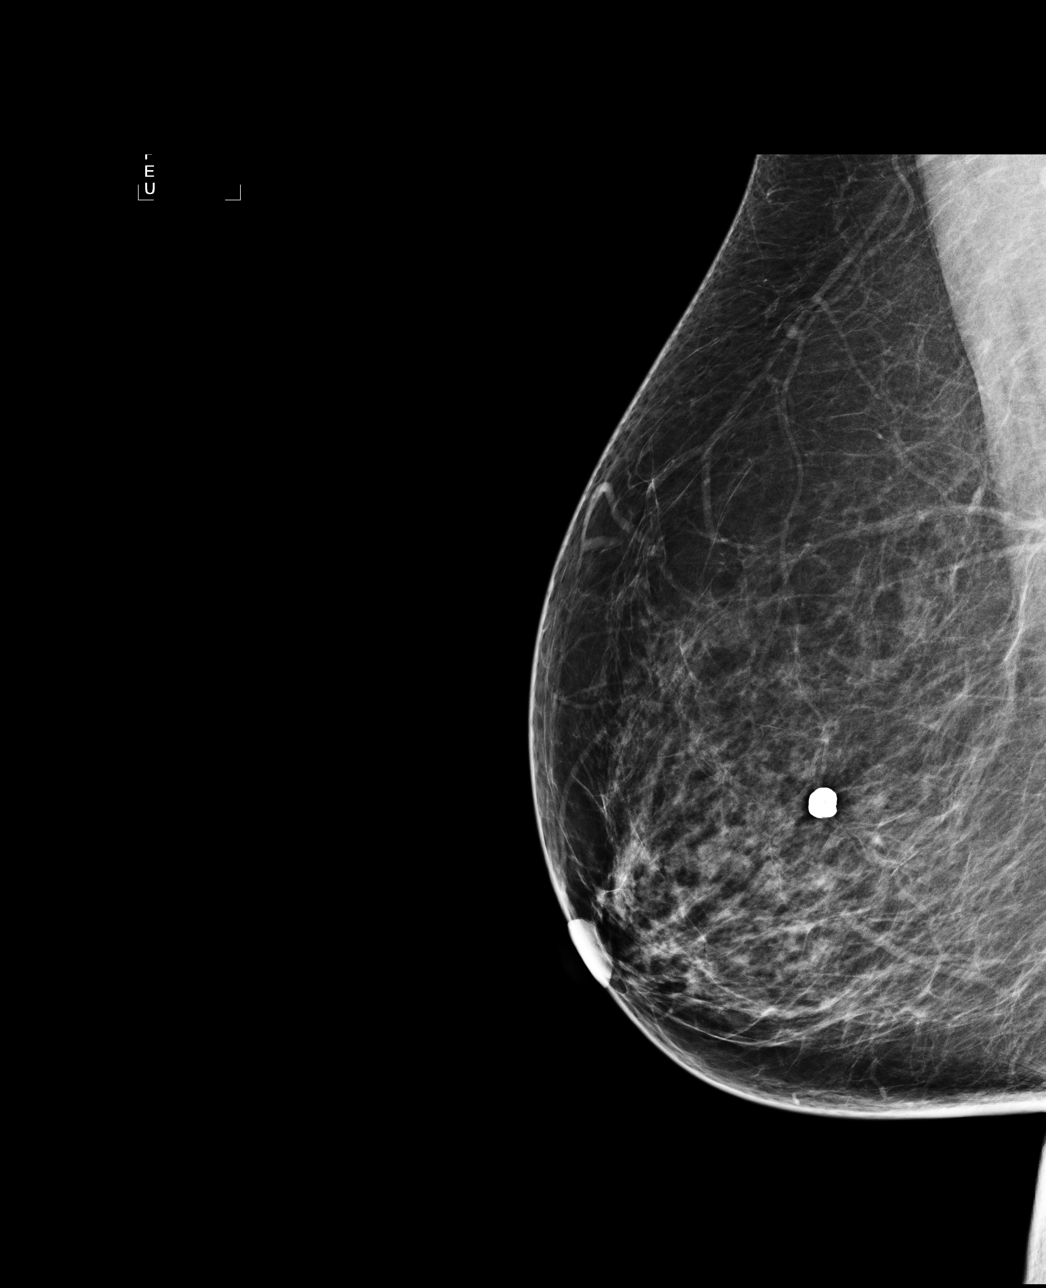

[4 of 4 positions shown; findings below may reference images not displayed]

ACR Breast Density Category b: There are scattered areas of
fibroglandular density.
FINDINGS: There are no findings suspicious for malignancy. Images were
processed with CAD.
IMPRESSION: No mammographic evidence of malignancy. A result letter of this
screening mammogram will be mailed directly to the patient.

RECOMMENDATION:
Screening mammogram in one year. (Code:[id number])

BI-RADS CATEGORY  1: Negative.

## 2016-08-26 ENCOUNTER — Other Ambulatory Visit: Payer: Self-pay | Admitting: Internal Medicine

## 2016-08-29 ENCOUNTER — Encounter: Payer: Self-pay | Admitting: Internal Medicine

## 2016-08-29 ENCOUNTER — Ambulatory Visit (INDEPENDENT_AMBULATORY_CARE_PROVIDER_SITE_OTHER): Payer: Self-pay | Admitting: Internal Medicine

## 2016-08-29 VITALS — BP 120/62 | HR 65 | Temp 97.8°F | Ht 65.5 in | Wt 145.4 lb

## 2016-08-29 DIAGNOSIS — E119 Type 2 diabetes mellitus without complications: Secondary | ICD-10-CM

## 2016-08-29 DIAGNOSIS — Z79899 Other long term (current) drug therapy: Secondary | ICD-10-CM

## 2016-08-29 DIAGNOSIS — Z0000 Encounter for general adult medical examination without abnormal findings: Secondary | ICD-10-CM

## 2016-08-29 DIAGNOSIS — E890 Postprocedural hypothyroidism: Secondary | ICD-10-CM

## 2016-08-29 DIAGNOSIS — E785 Hyperlipidemia, unspecified: Secondary | ICD-10-CM

## 2016-08-29 DIAGNOSIS — N3946 Mixed incontinence: Secondary | ICD-10-CM

## 2016-08-29 DIAGNOSIS — E1169 Type 2 diabetes mellitus with other specified complication: Secondary | ICD-10-CM

## 2016-08-29 DIAGNOSIS — Z7984 Long term (current) use of oral hypoglycemic drugs: Secondary | ICD-10-CM

## 2016-08-29 LAB — GLUCOSE, CAPILLARY: Glucose-Capillary: 110 mg/dL — ABNORMAL HIGH (ref 65–99)

## 2016-08-29 LAB — POCT GLYCOSYLATED HEMOGLOBIN (HGB A1C): HEMOGLOBIN A1C: 7.5

## 2016-08-29 MED ORDER — ATORVASTATIN CALCIUM 20 MG PO TABS: 20.0000 mg | ORAL_TABLET | Freq: Every day | ORAL | 3 refills | Status: DC

## 2016-08-29 MED ORDER — METFORMIN HCL 1000 MG PO TABS: 1000.0000 mg | ORAL_TABLET | Freq: Two times a day (BID) | ORAL | 11 refills | Status: DC

## 2016-08-29 MED ORDER — LISINOPRIL 5 MG PO TABS: 5.0000 mg | ORAL_TABLET | Freq: Every day | ORAL | 11 refills | Status: DC

## 2016-08-29 NOTE — Patient Instructions (Addendum)
It was a pleasure to see you again.  We are working on referrals for Ophthalmology and Urogynecology. I will send a referral for a mammogram as well.  [facility]. We will see what the thyroid test result is before I refill your Levothyroxine.  Your Hgb A1c is improved to 7.5. You are doing a good job with your diabetes!    Kegel Exercises The goal of Kegel exercises is to isolate and exercise your pelvic floor muscles. These muscles act as a hammock that supports the rectum, vagina, small intestine, and uterus. As the muscles weaken, the hammock sags and these organs are displaced from their normal positions. Kegel exercises can strengthen your pelvic floor muscles and help you to improve bladder and bowel control, improve sexual response, and help reduce many problems and some discomfort during pregnancy. Kegel exercises can be done anywhere and at any time. HOW TO PERFORM KEGEL EXERCISES 1. Locate your pelvic floor muscles. To do this, squeeze (contract) the muscles that you use when you try to stop the flow of urine. You will feel a tightness in the vaginal area (women) and a tight lift in the rectal area (men and women). 2. When you begin, contract your pelvic muscles tight for 2-5 seconds, then relax them for 2-5 seconds. This is one set. Do 4-5 sets with a short pause in between. 3. Contract your pelvic muscles for 8-10 seconds, then relax them for 8-10 seconds. Do 4-5 sets. If you cannot contract your pelvic muscles for 8-10 seconds, try 5-7 seconds and work your way up to 8-10 seconds. Your goal is 4-5 sets of 10 contractions each day. Keep your stomach, buttocks, and legs relaxed during the exercises. Perform sets of both short and long contractions. Vary your positions. Perform these contractions 3-4 times per day. Perform sets while you are:   Lying in bed in the morning.  Standing at lunch.  Sitting in the late  afternoon.  Lying in bed at night. You should do 40-50 contractions per day. Do not perform more Kegel exercises per day than recommended. Overexercising can cause muscle fatigue. Continue these exercises for for at least 15-20 weeks or as directed by your caregiver.   This information is not intended to replace advice given to you by your health care provider. Make sure you discuss any questions you have with your health care provider.   Document Released: 10/13/2012 Document Revised: 11/17/2014 Document Reviewed: 10/13/2012 Elsevier Interactive Patient Education [employer]2016 Elsevier Inc.

## 2016-08-29 NOTE — Assessment & Plan Note (Signed)
Last Hgb A1c was 8.1 in March 2017. Her Metformin was increased from 500 mg BID to 1000 mg BID at that time by her PCP. Patient reports adherence to this. She denies any symptoms of high or low blood sugars. She has a meter but has not checked her blood sugars recently. She was referred to Ophthalmology in March 2017 for evaluation for diabetic retinopathy and glaucoma, however this was never completed.  Hgb A1c improved to 7.5 today. Will continue current management.  -Continue Metformin 1000 mg BID -Repeat Hgb a1c in 3 months -f/u Ophthalmology referral

## 2016-08-29 NOTE — Assessment & Plan Note (Addendum)
Patient currently taking Levothyroxine 100 mcg daily. Last TSH was 2.940 one year ago. She reports some daytime fatigue, but no palpitations, heat/cold intolerance, diaphoresis, myalgias, numbness or tingling. -Check TSH today -Will refill Levothyroxine once TSH results in case dosing changes are indicated  ADDENDUM: TSH is low at 0.011. Will decrease Levothyroxine dosing to 75 mcg daily and refill at this dose. TSH should be rechecked in 8-12 weeks for further adjustment if necessary. I have sent a message to the patient updating her on results and plan.

## 2016-08-29 NOTE — Assessment & Plan Note (Signed)
Patient with continued urinary leakage likely from urge incontinence. There is a standing referral [facility] for further evaluation and pelvic floor PT.  -f/u on urogynecology referral -Provided information on kegel exercises

## 2016-08-29 NOTE — Progress Notes (Signed)
   CC: T2DM  HPI:  [name] is a 60 y.o. female from [place] who presents for follow up management of her T2DM, Mixed incontinence, Hypothyroidism, and healthcare maintenance.  T2DM: Last Hgb A1c was 8.1 in March 2017. Her Metformin was increased from 500 mg BID to 1000 mg BID at that time by her PCP. Patient reports adherence to this. She denies any symptoms of high or low blood sugars. She has a meter but has not checked her blood sugars recently. She was referred to Ophthalmology in March 2017 for evaluation for diabetic retinopathy and glaucoma, however this was never completed.  Mixed incontinence: Patient reports urinary leakage during the day, more noticeable at night, that has been a chronic issue. 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 She does not associate her leakage with cough. She reports urgency, but no dysuria or hematuria. She was [facility] by her PCP in March of this year, but this was not completed.  Hypothyroidism: Patient currently taking Levothyroxine 100 mcg daily. Last TSH was 2.940 one year ago. She reports some daytime fatigue, but no palpitations, heat/cold intolerance, diaphoresis, myalgias, numbness or tingling.  Health maintenance: Patient requesting a referral for mammogram. Last mammogram was normal in December 2016.  Patient [facility], which I have done.  Past Medical History:  Diagnosis Date  . Diabetes mellitus without complication (HCC)   . Hyperlipidemia   . Hypertension   . Thyroid disease     Review of Systems:   Review of Systems  Respiratory: Negative for shortness of breath.   Cardiovascular: Negative for chest pain, palpitations and leg swelling.  Genitourinary: Positive for urgency. Negative for dysuria, frequency and hematuria.    Musculoskeletal: Negative for myalgias.  Neurological: Positive for weakness. Negative for dizziness, tingling and sensory change.     Physical Exam:  Vitals:   08/29/16 1100  BP: 120/62  Pulse: 65  Temp: 97.8 F (36.6 C)  TempSrc: Oral  SpO2: 100%  Weight: 145 lb 6.4 oz (66 kg)  Height: 5' 5.5" (1.664 m)   Physical Exam  Constitutional: She is oriented to person, place, and time. No distress.  Pleasant elderly woman  HENT:  Head: Normocephalic and atraumatic.  Neck: Neck supple. No tracheal deviation present. No thyromegaly present.  Cardiovascular: Normal rate and regular rhythm.   No murmur heard. Pulmonary/Chest: Effort normal. No respiratory distress. She has no wheezes.  Musculoskeletal: She exhibits no edema or tenderness.  Neurological: She is alert and oriented to person, place, and time.  Skin: Skin is warm. She is not diaphoretic.    Assessment & Plan:   See Encounters Tab for problem based charting.  Patient discussed with Dr. Cleda DaubE. Hoffman

## 2016-08-29 NOTE — Assessment & Plan Note (Signed)
Patient requesting a referral for mammogram. Last mammogram was normal in December 2016.  I discussed the option of extending routine screening mammogram frequency to every two years, however patient prefers to continue with annual checks for now citing breast cancer history in her family as well as for peace of mind. I have sent a referral for screening mammogram.

## 2016-08-30 LAB — TSH: TSH: 0.011 u[IU]/mL — AB (ref 0.450–4.500)

## 2016-09-01 ENCOUNTER — Telehealth: Payer: Self-pay

## 2016-09-01 ENCOUNTER — Encounter: Payer: Self-pay | Admitting: Internal Medicine

## 2016-09-01 MED ORDER — LEVOTHYROXINE SODIUM 75 MCG PO TABS: 75.0000 ug | ORAL_TABLET | Freq: Every day | ORAL | 3 refills | Status: DC

## 2016-09-01 NOTE — Telephone Encounter (Signed)
Requesting lab result. Please call back. 

## 2016-09-01 NOTE — Addendum Note (Signed)
Addended by: Charlsie QuestPATEL, Dimples Probus R on: 09/01/2016 08:57 AM   Modules accepted: Orders

## 2016-09-01 NOTE — Progress Notes (Signed)
Internal Medicine Clinic Attending  Case discussed with Dr. Patel,Vishal at the time of the visit.  We reviewed the resident's history and exam and pertinent patient test results.  I agree with the assessment, diagnosis, and plan of care documented in the resident's note.  

## 2016-09-01 NOTE — Telephone Encounter (Signed)
I spoke to patient to discuss her lab results and change in levothyroxine dosing. She understands and agrees with the plan.

## 2016-09-05 ENCOUNTER — Other Ambulatory Visit: Payer: Self-pay

## 2016-09-05 NOTE — Telephone Encounter (Signed)
Talked to [place] at [facility] - stated pt need her medications.  Informed rx for Levothyroxine was sent 10/ 23 andLipitor and Metformin were refilled on 10/20. Olegario MessierKathy looked again and found rxs.

## 2016-09-05 NOTE — Telephone Encounter (Signed)
Olegario MessierKathy from health department needs to speak with a nurse. Please call back.

## 2016-09-16 ENCOUNTER — Telehealth (HOSPITAL_COMMUNITY): Payer: Self-pay | Admitting: *Deleted

## 2016-09-16 ENCOUNTER — Other Ambulatory Visit (HOSPITAL_COMMUNITY): Payer: Self-pay | Admitting: *Deleted

## 2016-09-16 DIAGNOSIS — N644 Mastodynia: Secondary | ICD-10-CM

## 2016-09-16 NOTE — Telephone Encounter (Signed)
Telephoned patient at home number and left message to return call to [facility] 

## 2016-09-25 ENCOUNTER — Other Ambulatory Visit (HOSPITAL_COMMUNITY): Payer: Self-pay | Admitting: Obstetrics and Gynecology

## 2016-09-25 ENCOUNTER — Ambulatory Visit (HOSPITAL_COMMUNITY)
Admission: RE | Admit: 2016-09-25 | Discharge: 2016-09-25 | Disposition: A | Discharge status: Home / Self Care | Payer: Self-pay | Source: Ambulatory Visit | Attending: Obstetrics and Gynecology | Admitting: Obstetrics and Gynecology

## 2016-09-25 ENCOUNTER — Ambulatory Visit
Admission: RE | Admit: 2016-09-25 | Discharge: 2016-09-25 | Disposition: A | Discharge status: Home / Self Care | Payer: No Typology Code available for payment source | Source: Ambulatory Visit | Attending: Obstetrics and Gynecology | Admitting: Obstetrics and Gynecology

## 2016-09-25 ENCOUNTER — Encounter (HOSPITAL_COMMUNITY): Payer: Self-pay

## 2016-09-25 VITALS — BP 108/64 | Temp 97.8°F | Ht 69.0 in | Wt 143.4 lb

## 2016-09-25 DIAGNOSIS — N644 Mastodynia: Secondary | ICD-10-CM

## 2016-09-25 DIAGNOSIS — Z01419 Encounter for gynecological examination (general) (routine) without abnormal findings: Secondary | ICD-10-CM

## 2016-09-25 NOTE — Progress Notes (Signed)
Complaints of left axillary pain that radiates to the top of the breast x 2 months that comes and goes. Patient rates pain at a 2-3 out of 10.  Pap Smear: Pap smear completed today. Last Pap smear was in 2006 in [place] and normal per patient. Per patient has no history of an abnormal Pap smear. No Pap smear results are in EPIC.  Physical exam: Breasts Breasts symmetrical. No skin abnormalities bilateral breasts. No nipple retraction bilateral breasts. No nipple discharge bilateral breasts. No lymphadenopathy. No lumps palpated bilateral breasts. Complaints of left outer breast tenderness on exam. [facility] for a diagnostic mammogram and possible left breast ultrasound. Appointment scheduled for Thursday, September 25, 2016 at 1150.  Pelvic/Bimanual   Ext Genitalia No lesions, no swelling and no discharge observed on external genitalia.         Vagina Vagina pink and normal texture. No lesions or discharge observed in vagina.          Cervix Cervix is present. Difficulty visualizing cervix on exam due patient complaining of discomfort. No discharge observed.     Uterus Uterus is present and palpable. Uterus in normal position and normal size.        Adnexae Bilateral ovaries present and palpable. No tenderness on palpation.          Rectovaginal No rectal exam completed today since patient had no rectal complaints. No skin abnormalities observed on exam.    Smoking History: Patient has never smoked.  Patient Navigation: Patient education provided. Access to services provided for patient through [facility] program.   Colorectal Cancer Screening: Per patient has never had a colonoscopy completed. No complaints today.

## 2016-09-25 NOTE — Patient Instructions (Signed)
Explained breast self awareness to Roberta Mullins. Let patient know [facility] will cover Pap smears and HPV typing every 5 years unless has a history of abnormal Pap smears.Ref[facility] for a diagnostic mammogram and possible left breast ultrasound. Appointment scheduled for Thursday, September 25, 2016 at 1150. Let patient know will follow up with her within the next couple weeks with results of Pap smear by phone. Roberta Mullins verbalized understanding.  Kenechukwu Eckstein, Kathaleen Maserhristine Poll, RN 12:08 PM

## 2016-09-26 ENCOUNTER — Encounter (HOSPITAL_COMMUNITY): Payer: Self-pay | Admitting: *Deleted

## 2016-09-29 LAB — CYTOLOGY - PAP
Diagnosis: NEGATIVE
HPV (WINDOPATH): NOT DETECTED

## 2016-10-15 ENCOUNTER — Telehealth (HOSPITAL_COMMUNITY): Payer: Self-pay | Admitting: *Deleted

## 2016-10-15 NOTE — Telephone Encounter (Signed)
Telephoned patient at home number and discussed negative pap smear results. HPV was negative. Next pap smear due in five years. Patient voiced understanding.  

## 2016-10-16 ENCOUNTER — Encounter: Payer: Self-pay | Admitting: Internal Medicine

## 2016-10-16 ENCOUNTER — Ambulatory Visit (INDEPENDENT_AMBULATORY_CARE_PROVIDER_SITE_OTHER): Payer: Self-pay | Admitting: Internal Medicine

## 2016-10-16 ENCOUNTER — Ambulatory Visit (HOSPITAL_COMMUNITY): Payer: No Typology Code available for payment source | Attending: Internal Medicine

## 2016-10-16 VITALS — BP 124/59 | HR 68 | Temp 97.8°F | Ht 67.0 in | Wt 141.5 lb

## 2016-10-16 DIAGNOSIS — E784 Other hyperlipidemia: Secondary | ICD-10-CM

## 2016-10-16 DIAGNOSIS — E119 Type 2 diabetes mellitus without complications: Secondary | ICD-10-CM

## 2016-10-16 DIAGNOSIS — Z0000 Encounter for general adult medical examination without abnormal findings: Secondary | ICD-10-CM

## 2016-10-16 DIAGNOSIS — R252 Cramp and spasm: Secondary | ICD-10-CM

## 2016-10-16 DIAGNOSIS — E890 Postprocedural hypothyroidism: Secondary | ICD-10-CM

## 2016-10-16 DIAGNOSIS — R208 Other disturbances of skin sensation: Secondary | ICD-10-CM

## 2016-10-16 DIAGNOSIS — R058 Other specified cough: Secondary | ICD-10-CM

## 2016-10-16 DIAGNOSIS — Z79899 Other long term (current) drug therapy: Secondary | ICD-10-CM

## 2016-10-16 DIAGNOSIS — R002 Palpitations: Secondary | ICD-10-CM | POA: Insufficient documentation

## 2016-10-16 DIAGNOSIS — E785 Hyperlipidemia, unspecified: Secondary | ICD-10-CM

## 2016-10-16 DIAGNOSIS — R202 Paresthesia of skin: Secondary | ICD-10-CM

## 2016-10-16 DIAGNOSIS — R05 Cough: Secondary | ICD-10-CM

## 2016-10-16 DIAGNOSIS — E1169 Type 2 diabetes mellitus with other specified complication: Secondary | ICD-10-CM

## 2016-10-16 DIAGNOSIS — Z7984 Long term (current) use of oral hypoglycemic drugs: Secondary | ICD-10-CM

## 2016-10-16 DIAGNOSIS — R9431 Abnormal electrocardiogram [ECG] [EKG]: Secondary | ICD-10-CM | POA: Insufficient documentation

## 2016-10-16 MED ORDER — LOSARTAN POTASSIUM 25 MG PO TABS: 25.0000 mg | ORAL_TABLET | Freq: Every day | ORAL | 2 refills | Status: AC

## 2016-10-16 MED ORDER — METFORMIN HCL 1000 MG PO TABS: 1000.0000 mg | ORAL_TABLET | Freq: Two times a day (BID) | ORAL | 3 refills | Status: AC

## 2016-10-16 MED ORDER — ATORVASTATIN CALCIUM 20 MG PO TABS: 20.0000 mg | ORAL_TABLET | Freq: Every day | ORAL | 3 refills | Status: DC

## 2016-10-16 NOTE — Assessment & Plan Note (Signed)
Patient has felt episodes of irregular heart beats at home without association to activity. No prior known history of arrhythmia. She noticed these more since the recent decrease in her levothyroxine dosing.   EKG obtained today which show NSR, rate 66, PR interval 0.16 secs, low-voltage aVL, normal r-wave transition. No irregular rhythm, arrhythmia, heart block, or acute ischemic changes seen.  Possible her palpitations are related to her hypothyroidism. May have an intermittent arrhythmia that we are not capturing. If symptoms continue, would consider cardiology referral for event monitor.

## 2016-10-16 NOTE — Assessment & Plan Note (Addendum)
Patient reports a dry cough beginning about 1.5 weeks ago. She had clear rhinorrhea about 7 days ago, but no associated sputum production, fevers, chills, postnasal drip, SOB, or other allergic symptoms. She is concerned this is related to her Lisinopril. She is taking Lisinopril 5 mg daily which is reportedly for renal protection due to history of proteinuria. Only urine study on file is a point of care dipstick from 09/2015 which showed protein when she was being evaluated for UTI/vaginitis.  Will switch Lisinopril to Losartan per patient preference and she will discuss whether she truly needs to continue the ARB for secondary renoprotective effects with assessment for proteinuria. -d/c Lisinopril 5 mg daily -Start Losartan 25 mg daily

## 2016-10-16 NOTE — Patient Instructions (Addendum)
It was a pleasure to see you again.  We will check your TSH and Lipid Panel today. I will refill your medications as 3 month supplies (90 days).  We checked an EKG today for the irregular heart beats you are feeling at home. This did not show any irregular rhythm. If you have continued feelings of irregular heart rhythm at home, we made need to consider referring you to Cardiology for a heart monitor.  We will change your Lisinopril to a medication called Losartan to reduce the risk of your cough. We will need to consider whether or not you need to continue this medication on follow up based on the following guidelines:  "There is insufficient evidence to recommend ACE inhibitor or ARB therapy for primary prevention in patients with type 2 diabetes who have normal albumin excretion and who are normotensive. In addition, there is evidence that such therapy is not helpful. These patients should be screened yearly for moderately increased albuminuria and an angiotensin inhibition initiated if persistent moderately increased albuminuria is documented."  We will talk about referring you to a diabetic nutrition counselor when you return from your trip.  You can try over the counter medicines like Ibuprofen or Advil for the pain in your feet.  Please let us know what type of meter you have so we can send the right meter strips in.

## 2016-10-16 NOTE — Assessment & Plan Note (Signed)
Patient requesting ophthalmology referral for diabetic retinopathy evaluation and reported history of cataracts. Retinal/fundus photography in January 2017 did not show evidence of diabetic retinopathy. She will be out of the [place] when she is due for recheck and prefers to get this done prior to her trip.  Patient will pay out of pocket for ophthalmology appointment. I have sent a referral and she has an appointment with Dr. Fabian SharpScott Groat on 10/21/2016 @ 1:30 PM.

## 2016-10-16 NOTE — Assessment & Plan Note (Addendum)
She is s/p radioiodine ablation for multinodular toxic goiter. Her last TSH was low at 0.011 on last visit and her Levothyroxine was decreased from 100 mcg to 75 mcg daily. Since the change in dosing, she has felt more fatigued with cold intolerance. She has felt episodes of irregular heart beats without association to activity, more noticeable at night. Weight is down 2 lbs from last visit.  Will recheck TSH today to assess for adequate levothyroxine dosing and adjust if necessary. Will hold off on refill today in case dose needs to be changed, otherwise will refill for 3 month supply. -Check TSH -Refill Levothyroxine for 3 month supply pending TSH result  ADDENDUM: TSH is 0.442, just below the low-end of normal. I have recommended we continue current Levothyroxine dose at 75 mcg rather than further reduction at this time. I have updated patient with the results and she understands. I will refill Levothyroxine 75 mcg once daily for a 90 day supply with 3 refills.

## 2016-10-16 NOTE — Assessment & Plan Note (Signed)
She is on moderate intensity Atorvastatin 20 mg daily. Last lipid profile was 09/2015. She is requesting a repeat lipid panel today. Her 10 year ASCVD risk is 5.6%.  Lipid Panel     Component Value Date/Time   CHOL 184 09/17/2015 1125   TRIG 55 09/17/2015 1125   HDL 65 09/17/2015 1125   CHOLHDL 2.8 09/17/2015 1125   LDLCALC 108 (H) 09/17/2015 1125     She is already on recommended moderate-intensity statin. Will recheck Lipid panel today per patient preference.

## 2016-10-16 NOTE — Progress Notes (Signed)
CC: Palpitations  HPI:  [name] is a 60 y.o. female with PMH of T2DM, Hypothyroidism (s/p radioiodine ablation for multinodular toxic goiter), HLD, reported history of cataracts who presents for follow up management of her T2DM, Hypothyroidism, HLD, and new complaint of palpitations, tingling in her toes, dry cough, referral to ophthalmology, and medication refill for 3 month supplies as [place] is going [place].  T2DM: Last Hgb A1c was 7.5 on 08/29/16. She takes Metformin 1000 mg BID. She is requesting a prescription for meter strips to check her blood sugars at home. She is unsure what type of strips she can use with her meter, but will let us know. Her son is with her today and reports that she has had dietary indiscretion, eating sugary foods. She is also taking Lisinopril 5 mg daily which is reportedly for renal protection due to history of proteinuria. Only urine study on file is a point of care dipstick from 09/2015 which showed protein when she was being evaluated for UTI/vaginitis.  Hypothyroidism: She is s/p radioiodine ablation for multinodular toxic goiter. Her last TSH was low at 0.011 on last visit and her Levothyroxine was decreased from 100 mcg to 75 mcg daily. Since the change in dosing, she has felt more fatigued with cold intolerance. She has felt episodes of irregular heart beats without association to activity, more noticeable at night. Weight is down 2 lbs from last visit.  Palpitations: Patient has felt episodes of irregular heart beats at home without association to activity. No prior known history of arrhythmia. She noticed these more since the recent decrease in her levothyroxine dosing.   Tingling in toes and sharp pain in heels: Patient reports a tingling sensation in her toes on both feet and intermittent stabbing sensation in both heels (left > right) which began about 10 days ago. She has had cramping in her toes as well. She does not  associate her symptoms with activity, time of day, or position. She has not had associated numbness, swelling, rash, focal weakness, or radiation of symptoms up her legs. No claudication symptoms.   Dry cough: Patient reports a dry cough beginning about 1.5 weeks ago. She had clear rhinorrhea about 7 days ago, but no associated sputum production, fevers, chills, postnasal drip, SOB, or other allergic symptoms. She is concerned this is related to her Lisinopril.  HLD: She is on moderate intensity Atorvastatin 20 mg daily. Last lipid profile was 09/2015. She is requesting a repeat lipid panel today. Her 10 year ASCVD risk is 5.6%.   Health maintenance: Patient requesting ophthalmology referral for diabetic retinopathy evaluation and reported history of cataracts. Retinal/fundus photography in January 2017 did not show evidence of diabetic retinopathy. She will be out of the country when she is due for recheck and prefers to get this done prior to her trip.   Past Medical History:  Diagnosis Date  . Diabetes mellitus without complication (HCC)   . Hyperlipidemia   . Hypertension   . Thyroid disease     Review of Systems:   Review of Systems  Constitutional: Positive for malaise/fatigue. Negative for chills, diaphoresis and fever.  Eyes:       Cataracts  Respiratory: Positive for cough. Negative for sputum production and shortness of breath.   Cardiovascular: Positive for palpitations. Negative for chest pain and leg swelling.  Musculoskeletal: Negative for falls.  Neurological: Positive for tingling and weakness. Negative for dizziness, sensory change, focal weakness and loss of consciousness.  Tingling in bilateral toes, sharp sensation bilateral heels Left > Right.     Physical Exam:  Vitals:   10/16/16 1330  BP: (!) 124/59  Pulse: 68  Temp: 97.8 F (36.6 C)  TempSrc: Oral  SpO2: 100%  Weight: 141 lb 8 oz (64.2 kg)  Height: 5\' 7"  (1.702 m)   Physical Exam    Constitutional: She is oriented to person, place, and time. She appears well-developed and well-nourished. No distress.  HENT:  Head: Normocephalic and atraumatic.  Neck: Neck supple.  Cardiovascular: Normal rate and regular rhythm.  Exam reveals no friction rub.   No murmur heard. Pulses:      Dorsalis pedis pulses are 2+ on the right side, and 2+ on the left side.       Posterior tibial pulses are 2+ on the right side, and 2+ on the left side.  No carotid bruits  Pulmonary/Chest: Effort normal. No respiratory distress. She has no wheezes. She has no rales.  Musculoskeletal: Normal range of motion. She exhibits no edema or tenderness.  Neurological: She is alert and oriented to person, place, and time.  Skin: Skin is warm. No rash noted. She is not diaphoretic. No erythema.    Assessment & Plan:   See Encounters Tab for problem based charting.  Patient discussed with Dr. Rogelia BogaButcher

## 2016-10-16 NOTE — Assessment & Plan Note (Addendum)
Last Hgb A1c was 7.5 on 08/29/16. She takes Metformin 1000 mg BID. She is requesting a prescription for meter strips to check her blood sugars at home. She is unsure what type of strips she can use with her meter, but will let us know. Her son is with her today and reports that she has had dietary indiscretion, eating sugary foods. She is also taking Lisinopril 5 mg daily which is reportedly for renal protection due to history of proteinuria. Only urine study on file is a point of care dipstick from 09/2015 which showed protein when she was being evaluated for UTI/vaginitis.  Patient's diabetes is well-controlled. I advised her that she really does not need to continue to check her blood sugars at home, but she prefers to continue to do so. Unclear whether she truly has proteinuria and needs to continue ACE-I or ARB for secondary renoprotective effects. She prefers to continue with an ARB while she is away and discuss this again with her PCP on follow up. Will change Lisinopril to Losartan given her cough. -Continue Metformin 1000 mg BID -Change Lisinopril 5 mg daily to Losartan 25 mg daily -Assess for proteinuria and continued ARB use on follow up -Patient will call with meter type for strips refill -Recheck Hgb A1c on follow up -Discuss referral to our diabetic/nutrition counselor Norm Parcelonna Plyler, RD on follow up

## 2016-10-16 NOTE — Assessment & Plan Note (Signed)
Patient reports a tingling sensation in her toes on both feet and intermittent stabbing sensation in both heels (left > right) which began about 10 days ago. She has had cramping in her toes as well. She does not associate her symptoms with activity, time of day, or position. She has not had associated numbness, swelling, rash, focal weakness, or radiation of symptoms up her legs. No claudication symptoms.   Unclear cause of symptoms. Diabetes is well-controlled, so lower suspicion this is diabetic neuropathy. Pedal pulses are intact without claudication symptoms to suggest PVD. She does not have plantar tenderness typical for plantar fasciitis. Possible her symptoms may be related hypothyroidism. For now will treat symptomatically with OTC NSAIDs as needed and f/u on TSH.

## 2016-10-17 ENCOUNTER — Other Ambulatory Visit: Payer: Self-pay | Admitting: Internal Medicine

## 2016-10-17 LAB — LIPID PANEL
CHOL/HDL RATIO: 2.9 ratio (ref 0.0–4.4)
CHOLESTEROL TOTAL: 176 mg/dL (ref 100–199)
HDL: 61 mg/dL (ref >39)
LDL CALC: 100 mg/dL — AB (ref 0–99)
Triglycerides: 73 mg/dL (ref 0–149)
VLDL Cholesterol Cal: 15 mg/dL (ref 5–40)

## 2016-10-17 LAB — TSH: TSH: 0.442 u[IU]/mL — ABNORMAL LOW (ref 0.450–4.500)

## 2016-10-17 MED ORDER — ATORVASTATIN CALCIUM 20 MG PO TABS: 20.0000 mg | ORAL_TABLET | Freq: Every day | ORAL | 3 refills | Status: AC

## 2016-10-17 MED ORDER — LEVOTHYROXINE SODIUM 75 MCG PO TABS: 75.0000 ug | ORAL_TABLET | Freq: Every day | ORAL | 3 refills | Status: AC

## 2016-10-17 MED ORDER — GLUCOSE BLOOD VI STRP: ORAL_STRIP | 12 refills | Status: AC

## 2016-10-17 NOTE — Addendum Note (Signed)
Addended by: Darreld McleanPATEL, Denis Koppel R on: 10/17/2016 12:55 PM   Modules accepted: Orders

## 2016-10-21 ENCOUNTER — Ambulatory Visit (INDEPENDENT_AMBULATORY_CARE_PROVIDER_SITE_OTHER): Payer: Self-pay | Admitting: Internal Medicine

## 2016-10-21 ENCOUNTER — Ambulatory Visit (HOSPITAL_COMMUNITY)
Admission: RE | Admit: 2016-10-21 | Discharge: 2016-10-21 | Disposition: A | Discharge status: Home / Self Care | Payer: No Typology Code available for payment source | Source: Ambulatory Visit | Attending: Student in an Organized Health Care Education/Training Program | Admitting: Student in an Organized Health Care Education/Training Program

## 2016-10-21 VITALS — BP 117/56 | HR 70 | Temp 97.5°F

## 2016-10-21 DIAGNOSIS — M47896 Other spondylosis, lumbar region: Secondary | ICD-10-CM | POA: Insufficient documentation

## 2016-10-21 DIAGNOSIS — M79605 Pain in left leg: Secondary | ICD-10-CM

## 2016-10-21 NOTE — Progress Notes (Signed)
   CC: Left leg pain  HPI:  [name] is a 60 y.o. female with PMH as listed below who presents for management of acute left leg pain.  Left leg pain: Pain began 4 days ago with sudden onset initially involving the left knee with a stabbing type sensation. She was up doing chores around the house when the pain began, without any obvious trauma or injury. Pain is located at her left lateral knee and radiates up her lateral leg to her hip, groin, and left lower back. She says the pain is worse in the morning when she gets up and improves with activity. She has been hesitant to bear weight on her left leg. She says the pain is worse when laying on her contralateral side at night. She has tried Tylenol at night with relief. She denies any increase in activity, heavy lifting, fevers, chills, swelling, erythema, or similar episode in the past.  Past Medical History:  Diagnosis Date  . Diabetes mellitus without complication (HCC)   . Hyperlipidemia   . Hypertension   . Thyroid disease     Review of Systems:   Review of Systems  Constitutional: Negative for chills and fever.  Musculoskeletal: Positive for back pain and joint pain. Negative for falls and myalgias.       Left knee and hip pain  Skin: Negative for rash.  Neurological: Negative for tingling, sensory change, focal weakness, loss of consciousness and weakness.     Physical Exam:  Vitals:   10/21/16 1103  BP: (!) 117/56  Pulse: 70  Temp: 97.5 F (36.4 C)  TempSrc: Oral  SpO2: 100%   Physical Exam  Constitutional: She is oriented to person, place, and time. She appears well-developed and well-nourished. No distress.  Pulmonary/Chest: No respiratory distress.  Musculoskeletal: Normal range of motion. She exhibits no edema.  Mild tenderness to palpation over the left superolateral knee. Pain elicited at lower back with passive elevation of the legs. Strength intact without joint laxity. Negative  trendelenburg test. No obvious effusion on exam. No erythema, rash, or increased warmth to touch.  Neurological: She is alert and oriented to person, place, and time.  Skin: She is not diaphoretic.   Bedside U/S of knee and lateral hip: U/S without any significant effusion in the suprapatellar pouch or degenerative changes at the knee. Lateral hip U/S without obvious effusion or inflammatory changes over the observed iliotibial band.  Assessment & Plan:   See Encounters Tab for problem based charting.  Patient seen with Dr. Oswaldo DoneVincent

## 2016-10-21 NOTE — Patient Instructions (Addendum)
It was a pleasure to see you again Roberta Mullins.  We did an ultrasound of your left knee and side of your hip which looked normal.  I will order an xray of your left hip for further assessment.  You can take over the counter anti-inflammatory medications like Ibuprofen 400 mg every 6 hours as needed and/or Tylenol as needed for your pain.  Hopefully your pain will improve over the next week.  I will call you with the xray results.   Ibuprofen tablets and capsules What is this medicine? IBUPROFEN (eye BYOO proe fen) is a non-steroidal anti-inflammatory drug (NSAID). It is used for dental pain, fever, headaches or migraines, osteoarthritis, rheumatoid arthritis, or painful monthly periods. It can also relieve minor aches and pains caused by a cold, flu, or sore throat. This medicine may be used for other purposes; ask your health care provider or pharmacist if you have questions. COMMON BRAND NAME(S): Advil, Advil Junior Strength, Advil Migraine, Genpril, Ibren, IBU, Midol, Midol Cramps and Body Aches, Motrin, Motrin IB, Motrin Junior Strength, Motrin Migraine Pain, Samson-8, Toxicology Saliva Collection What should I tell my health care provider before I take this medicine? They need to know if you have any of these conditions: -asthma -cigarette smoker -drink more than 3 alcohol containing drinks a day -heart disease or circulation problems such as heart failure or leg edema (fluid retention) -high blood pressure -kidney disease -liver disease -stomach bleeding or ulcers -an unusual or allergic reaction to ibuprofen, aspirin, other NSAIDS, other medicines, foods, dyes, or preservatives -pregnant or trying to get pregnant -breast-feeding How should I use this medicine? Take this medicine by mouth with a glass of water. Follow the directions on the prescription label. Take this medicine with food if your stomach gets upset. Try to not lie down for at least 10 minutes after you take the  medicine. Take your medicine at regular intervals. Do not take your medicine more often than directed. A special MedGuide will be given to you by the pharmacist with each prescription and refill. Be sure to read this information carefully each time. Talk to your pediatrician regarding the use of this medicine in children. Special care may be needed. Overdosage: If you think you have taken too much of this medicine contact a poison control center or emergency room at once. NOTE: This medicine is only for you. Do not share this medicine with others. What if I miss a dose? If you miss a dose, take it as soon as you can. If it is almost time for your next dose, take only that dose. Do not take double or extra doses. What may interact with this medicine? Do not take this medicine with any of the following medications: -cidofovir -ketorolac -methotrexate -pemetrexed This medicine may also interact with the following medications: -alcohol -aspirin -diuretics -lithium -other drugs for inflammation like prednisone -warfarin This list may not describe all possible interactions. Give your health care provider a list of all the medicines, herbs, non-prescription drugs, or dietary supplements you use. Also tell them if you smoke, drink alcohol, or use illegal drugs. Some items may interact with your medicine. What should I watch for while using this medicine? Tell your doctor or healthcare professional if your symptoms do not start to get better or if they get worse. This medicine does not prevent heart attack or stroke. In fact, this medicine may increase the chance of a heart attack or stroke. The chance may increase with longer use of this  medicine and in people who have heart disease. If you take aspirin to prevent heart attack or stroke, talk with your doctor or health care professional. Do not take other medicines that contain aspirin, ibuprofen, or naproxen with this medicine. Side effects such as  stomach upset, nausea, or ulcers may be more likely to occur. Many medicines available without a prescription should not be taken with this medicine. This medicine can cause ulcers and bleeding in the stomach and intestines at any time during treatment. Ulcers and bleeding can happen without warning symptoms and can cause death. To reduce your risk, do not smoke cigarettes or drink alcohol while you are taking this medicine. You may get drowsy or dizzy. Do not drive, use machinery, or do anything that needs mental alertness until you know how this medicine affects you. Do not stand or sit up quickly, especially if you are an older patient. This reduces the risk of dizzy or fainting spells. This medicine can cause you to bleed more easily. Try to avoid damage to your teeth and gums when you brush or floss your teeth. This medicine may be used to treat migraines. If you take migraine medicines for 10 or more days a month, your migraines may get worse. Keep a diary of headache days and medicine use. Contact your healthcare professional if your migraine attacks occur more frequently. What side effects may I notice from receiving this medicine? Side effects that you should report to your doctor or health care professional as soon as possible: -allergic reactions like skin rash, itching or hives, swelling of the face, lips, or tongue -severe stomach pain -signs and symptoms of bleeding such as bloody or black, tarry stools; red or dark-brown urine; spitting up blood or brown material that looks like coffee grounds; red spots on the skin; unusual bruising or bleeding from the eye, gums, or nose -signs and symptoms of a blood clot such as changes in vision; chest pain; severe, sudden headache; trouble speaking; sudden numbness or weakness of the face, arm, or leg -unexplained weight gain or swelling -unusually weak or tired -yellowing of eyes or skin Side effects that usually do not require medical attention  (report to your doctor or health care professional if they continue or are bothersome): -bruising -diarrhea -dizziness, drowsiness -headache -nausea, vomiting This list may not describe all possible side effects. Call your doctor for medical advice about side effects. You may report side effects to FDA at 1-800-FDA-1088. Where should I keep my medicine? Keep out of the reach of children. Store at room temperature between 15 and 30 degrees C (59 and 86 degrees F). Keep container tightly closed. Throw away any unused medicine after the expiration date. NOTE: This sheet is a summary. It may not cover all possible information. If you have questions about this medicine, talk to your doctor, pharmacist, or health care provider.  2017 Elsevier/Gold Standard (2013-06-28 10:48:02)

## 2016-10-21 NOTE — Assessment & Plan Note (Addendum)
Pain began 4 days ago with sudden onset initially involving the left knee with a stabbing type sensation. She was up doing chores around the house when the pain began, without any obvious trauma or injury. Pain is located at her left lateral knee and radiates up her lateral leg to her hip, groin, and left lower back. She says the pain is worse in the morning when she gets up and improves with activity. She has been hesitant to bear weight on her left leg. She says the pain is worse when laying on her contralateral side at night. She has tried Tylenol at night with relief. She denies any increase in activity, heavy lifting, fevers, chills, swelling, erythema, or similar episode in the past.  Patient with non-specific knee, hip, and back pain. Bedside U/S was without any obvious joint effusion or inflammatory changes at the left knee or left lateral hip. History and exam inconsistent with ligamentous tear, septic joint, or gouty arthritis. Differentials include iliotibial strain and/or osteoarthritis (would expect more chronic course with OA).  Plain films of the left hip are obtained to evaluate for any hip abnormality including degeneration or avascular necrosis. Xrays showed degenerative changes at both hips and lumbar spine as well as calcification at the left greater trochanter suggestive of prior ligamentous tear or bursitis.  Will continue with conservative treatment at this time with NSAIDs for pain and inflammation and/or Tylenol as needed with the expectation of improvement over the next 1-2 weeks.  ADDENDUM: I discussed the results of the left hip xray with Roberta Mullins and her son and continued recommendation of conservative therapy with rest, stretching exercises, and as needed NSAIDs and/or Tylenol. They understand and are appreciative.

## 2016-10-22 NOTE — Progress Notes (Signed)
[facility] Clinic Attending  I saw and evaluated the patient.  I personally confirmed the key portions of the history and exam documented by Dr. Patel,Vishal and I reviewed pertinent patient test results.  The assessment, diagnosis, and plan were formulated together and I agree with the documentation in the resident's note.  

## 2016-10-24 NOTE — Progress Notes (Signed)
Internal Medicine Clinic Attending  Case discussed with Dr. Patel,Vishal at the time of the visit.  We reviewed the resident's history and exam and pertinent patient test results.  I agree with the assessment, diagnosis, and plan of care documented in the resident's note.  

## 2016-11-20 NOTE — Addendum Note (Signed)
Addended by: Neomia DearPOWERS, Yalda Herd E on: 11/20/2016 08:24 PM   Modules accepted: Orders

## 2017-01-01 ENCOUNTER — Telehealth: Payer: Self-pay | Admitting: Internal Medicine

## 2017-01-01 NOTE — Telephone Encounter (Signed)
APT. REMINDER CALL, NO ANSWER, NO VOICEMAIL °

## 2017-01-02 ENCOUNTER — Encounter: Payer: Self-pay | Admitting: Internal Medicine

## 2017-01-02 ENCOUNTER — Encounter: Payer: No Typology Code available for payment source | Admitting: Internal Medicine

## 2017-03-20 NOTE — Progress Notes (Signed)
This encounter was created in error - please disregard.

## 2017-06-26 ENCOUNTER — Encounter: Payer: No Typology Code available for payment source | Admitting: Internal Medicine

## 2017-07-08 ENCOUNTER — Encounter: Payer: Self-pay | Admitting: Internal Medicine

## 2017-08-07 ENCOUNTER — Encounter: Payer: No Typology Code available for payment source | Admitting: Internal Medicine

## 2017-08-07 ENCOUNTER — Encounter: Payer: Self-pay | Admitting: Internal Medicine

## 2017-08-11 ENCOUNTER — Encounter: Payer: Self-pay | Admitting: Internal Medicine

## 2017-09-25 ENCOUNTER — Encounter: Payer: No Typology Code available for payment source | Admitting: Internal Medicine

## 2017-10-12 ENCOUNTER — Encounter: Payer: Self-pay | Admitting: Internal Medicine

## 2021-09-15 LAB — COLOGUARD: COLOGUARD: NEGATIVE
